# Patient Record
Sex: Male | Born: 1960 | Race: White | Hispanic: No | Marital: Married | Smoking: Never smoker
Health system: Southern US, Community
[De-identification: ages and names within clinical notes are randomized; demographics above are authoritative.]

## PROBLEM LIST (undated history)

## (undated) DIAGNOSIS — T7840XA Allergy, unspecified, initial encounter: Secondary | ICD-10-CM

## (undated) HISTORY — DX: Allergy, unspecified, initial encounter: T78.40XA

---

## 1996-04-16 HISTORY — PX: KNEE SURGERY: SHX244

## 2010-03-29 ENCOUNTER — Ambulatory Visit: Payer: Self-pay | Admitting: Allergy

## 2012-04-07 ENCOUNTER — Ambulatory Visit: Payer: Self-pay | Admitting: Gastroenterology

## 2015-10-21 DIAGNOSIS — J3089 Other allergic rhinitis: Secondary | ICD-10-CM | POA: Diagnosis not present

## 2015-10-21 DIAGNOSIS — J301 Allergic rhinitis due to pollen: Secondary | ICD-10-CM | POA: Diagnosis not present

## 2015-10-21 DIAGNOSIS — J3081 Allergic rhinitis due to animal (cat) (dog) hair and dander: Secondary | ICD-10-CM | POA: Diagnosis not present

## 2015-10-27 DIAGNOSIS — J301 Allergic rhinitis due to pollen: Secondary | ICD-10-CM | POA: Diagnosis not present

## 2015-10-27 DIAGNOSIS — J3081 Allergic rhinitis due to animal (cat) (dog) hair and dander: Secondary | ICD-10-CM | POA: Diagnosis not present

## 2015-10-27 DIAGNOSIS — J4540 Moderate persistent asthma, uncomplicated: Secondary | ICD-10-CM | POA: Diagnosis not present

## 2015-10-27 DIAGNOSIS — J3089 Other allergic rhinitis: Secondary | ICD-10-CM | POA: Diagnosis not present

## 2015-10-31 DIAGNOSIS — J3089 Other allergic rhinitis: Secondary | ICD-10-CM | POA: Diagnosis not present

## 2015-10-31 DIAGNOSIS — J3081 Allergic rhinitis due to animal (cat) (dog) hair and dander: Secondary | ICD-10-CM | POA: Diagnosis not present

## 2015-11-04 DIAGNOSIS — J3089 Other allergic rhinitis: Secondary | ICD-10-CM | POA: Diagnosis not present

## 2015-11-04 DIAGNOSIS — J3081 Allergic rhinitis due to animal (cat) (dog) hair and dander: Secondary | ICD-10-CM | POA: Diagnosis not present

## 2015-11-04 DIAGNOSIS — J301 Allergic rhinitis due to pollen: Secondary | ICD-10-CM | POA: Diagnosis not present

## 2015-11-08 DIAGNOSIS — J301 Allergic rhinitis due to pollen: Secondary | ICD-10-CM | POA: Diagnosis not present

## 2015-11-08 DIAGNOSIS — J3081 Allergic rhinitis due to animal (cat) (dog) hair and dander: Secondary | ICD-10-CM | POA: Diagnosis not present

## 2015-11-08 DIAGNOSIS — J3089 Other allergic rhinitis: Secondary | ICD-10-CM | POA: Diagnosis not present

## 2015-11-22 DIAGNOSIS — J3089 Other allergic rhinitis: Secondary | ICD-10-CM | POA: Diagnosis not present

## 2015-11-22 DIAGNOSIS — J3081 Allergic rhinitis due to animal (cat) (dog) hair and dander: Secondary | ICD-10-CM | POA: Diagnosis not present

## 2015-11-22 DIAGNOSIS — J301 Allergic rhinitis due to pollen: Secondary | ICD-10-CM | POA: Diagnosis not present

## 2015-11-24 DIAGNOSIS — J3089 Other allergic rhinitis: Secondary | ICD-10-CM | POA: Diagnosis not present

## 2015-11-24 DIAGNOSIS — J301 Allergic rhinitis due to pollen: Secondary | ICD-10-CM | POA: Diagnosis not present

## 2015-11-24 DIAGNOSIS — J3081 Allergic rhinitis due to animal (cat) (dog) hair and dander: Secondary | ICD-10-CM | POA: Diagnosis not present

## 2015-11-29 DIAGNOSIS — J3081 Allergic rhinitis due to animal (cat) (dog) hair and dander: Secondary | ICD-10-CM | POA: Diagnosis not present

## 2015-11-29 DIAGNOSIS — J3089 Other allergic rhinitis: Secondary | ICD-10-CM | POA: Diagnosis not present

## 2015-11-29 DIAGNOSIS — J301 Allergic rhinitis due to pollen: Secondary | ICD-10-CM | POA: Diagnosis not present

## 2015-12-01 DIAGNOSIS — J3081 Allergic rhinitis due to animal (cat) (dog) hair and dander: Secondary | ICD-10-CM | POA: Diagnosis not present

## 2015-12-01 DIAGNOSIS — J301 Allergic rhinitis due to pollen: Secondary | ICD-10-CM | POA: Diagnosis not present

## 2015-12-01 DIAGNOSIS — J3089 Other allergic rhinitis: Secondary | ICD-10-CM | POA: Diagnosis not present

## 2015-12-06 DIAGNOSIS — J3081 Allergic rhinitis due to animal (cat) (dog) hair and dander: Secondary | ICD-10-CM | POA: Diagnosis not present

## 2015-12-06 DIAGNOSIS — J301 Allergic rhinitis due to pollen: Secondary | ICD-10-CM | POA: Diagnosis not present

## 2015-12-06 DIAGNOSIS — J3089 Other allergic rhinitis: Secondary | ICD-10-CM | POA: Diagnosis not present

## 2015-12-22 DIAGNOSIS — J301 Allergic rhinitis due to pollen: Secondary | ICD-10-CM | POA: Diagnosis not present

## 2015-12-22 DIAGNOSIS — J3089 Other allergic rhinitis: Secondary | ICD-10-CM | POA: Diagnosis not present

## 2015-12-22 DIAGNOSIS — J3081 Allergic rhinitis due to animal (cat) (dog) hair and dander: Secondary | ICD-10-CM | POA: Diagnosis not present

## 2015-12-29 DIAGNOSIS — J301 Allergic rhinitis due to pollen: Secondary | ICD-10-CM | POA: Diagnosis not present

## 2015-12-29 DIAGNOSIS — J3081 Allergic rhinitis due to animal (cat) (dog) hair and dander: Secondary | ICD-10-CM | POA: Diagnosis not present

## 2015-12-29 DIAGNOSIS — J3089 Other allergic rhinitis: Secondary | ICD-10-CM | POA: Diagnosis not present

## 2016-01-03 DIAGNOSIS — J301 Allergic rhinitis due to pollen: Secondary | ICD-10-CM | POA: Diagnosis not present

## 2016-01-03 DIAGNOSIS — J3089 Other allergic rhinitis: Secondary | ICD-10-CM | POA: Diagnosis not present

## 2016-01-10 DIAGNOSIS — J3089 Other allergic rhinitis: Secondary | ICD-10-CM | POA: Diagnosis not present

## 2016-01-10 DIAGNOSIS — J3081 Allergic rhinitis due to animal (cat) (dog) hair and dander: Secondary | ICD-10-CM | POA: Diagnosis not present

## 2016-01-10 DIAGNOSIS — J301 Allergic rhinitis due to pollen: Secondary | ICD-10-CM | POA: Diagnosis not present

## 2016-01-17 DIAGNOSIS — J301 Allergic rhinitis due to pollen: Secondary | ICD-10-CM | POA: Diagnosis not present

## 2016-01-17 DIAGNOSIS — J3089 Other allergic rhinitis: Secondary | ICD-10-CM | POA: Diagnosis not present

## 2016-01-17 DIAGNOSIS — J3081 Allergic rhinitis due to animal (cat) (dog) hair and dander: Secondary | ICD-10-CM | POA: Diagnosis not present

## 2016-01-26 DIAGNOSIS — J3081 Allergic rhinitis due to animal (cat) (dog) hair and dander: Secondary | ICD-10-CM | POA: Diagnosis not present

## 2016-01-26 DIAGNOSIS — J3089 Other allergic rhinitis: Secondary | ICD-10-CM | POA: Diagnosis not present

## 2016-01-26 DIAGNOSIS — J301 Allergic rhinitis due to pollen: Secondary | ICD-10-CM | POA: Diagnosis not present

## 2016-01-31 DIAGNOSIS — J3089 Other allergic rhinitis: Secondary | ICD-10-CM | POA: Diagnosis not present

## 2016-01-31 DIAGNOSIS — J301 Allergic rhinitis due to pollen: Secondary | ICD-10-CM | POA: Diagnosis not present

## 2016-01-31 DIAGNOSIS — J3081 Allergic rhinitis due to animal (cat) (dog) hair and dander: Secondary | ICD-10-CM | POA: Diagnosis not present

## 2016-02-07 DIAGNOSIS — J3089 Other allergic rhinitis: Secondary | ICD-10-CM | POA: Diagnosis not present

## 2016-02-07 DIAGNOSIS — J301 Allergic rhinitis due to pollen: Secondary | ICD-10-CM | POA: Diagnosis not present

## 2016-02-07 DIAGNOSIS — J3081 Allergic rhinitis due to animal (cat) (dog) hair and dander: Secondary | ICD-10-CM | POA: Diagnosis not present

## 2016-02-14 DIAGNOSIS — J301 Allergic rhinitis due to pollen: Secondary | ICD-10-CM | POA: Diagnosis not present

## 2016-02-14 DIAGNOSIS — J3081 Allergic rhinitis due to animal (cat) (dog) hair and dander: Secondary | ICD-10-CM | POA: Diagnosis not present

## 2016-02-14 DIAGNOSIS — J3089 Other allergic rhinitis: Secondary | ICD-10-CM | POA: Diagnosis not present

## 2016-02-16 DIAGNOSIS — Z23 Encounter for immunization: Secondary | ICD-10-CM | POA: Diagnosis not present

## 2016-02-23 DIAGNOSIS — J301 Allergic rhinitis due to pollen: Secondary | ICD-10-CM | POA: Diagnosis not present

## 2016-02-23 DIAGNOSIS — J3081 Allergic rhinitis due to animal (cat) (dog) hair and dander: Secondary | ICD-10-CM | POA: Diagnosis not present

## 2016-02-23 DIAGNOSIS — J3089 Other allergic rhinitis: Secondary | ICD-10-CM | POA: Diagnosis not present

## 2016-03-13 DIAGNOSIS — J3089 Other allergic rhinitis: Secondary | ICD-10-CM | POA: Diagnosis not present

## 2016-03-13 DIAGNOSIS — J3081 Allergic rhinitis due to animal (cat) (dog) hair and dander: Secondary | ICD-10-CM | POA: Diagnosis not present

## 2016-03-13 DIAGNOSIS — J301 Allergic rhinitis due to pollen: Secondary | ICD-10-CM | POA: Diagnosis not present

## 2016-03-22 DIAGNOSIS — J3089 Other allergic rhinitis: Secondary | ICD-10-CM | POA: Diagnosis not present

## 2016-03-22 DIAGNOSIS — J301 Allergic rhinitis due to pollen: Secondary | ICD-10-CM | POA: Diagnosis not present

## 2016-03-22 DIAGNOSIS — J3081 Allergic rhinitis due to animal (cat) (dog) hair and dander: Secondary | ICD-10-CM | POA: Diagnosis not present

## 2016-03-27 DIAGNOSIS — J301 Allergic rhinitis due to pollen: Secondary | ICD-10-CM | POA: Diagnosis not present

## 2016-03-27 DIAGNOSIS — J3081 Allergic rhinitis due to animal (cat) (dog) hair and dander: Secondary | ICD-10-CM | POA: Diagnosis not present

## 2016-03-27 DIAGNOSIS — J3089 Other allergic rhinitis: Secondary | ICD-10-CM | POA: Diagnosis not present

## 2016-04-03 DIAGNOSIS — J301 Allergic rhinitis due to pollen: Secondary | ICD-10-CM | POA: Diagnosis not present

## 2016-04-03 DIAGNOSIS — J3081 Allergic rhinitis due to animal (cat) (dog) hair and dander: Secondary | ICD-10-CM | POA: Diagnosis not present

## 2016-04-03 DIAGNOSIS — J3089 Other allergic rhinitis: Secondary | ICD-10-CM | POA: Diagnosis not present

## 2016-04-12 DIAGNOSIS — J3089 Other allergic rhinitis: Secondary | ICD-10-CM | POA: Diagnosis not present

## 2016-04-12 DIAGNOSIS — J3081 Allergic rhinitis due to animal (cat) (dog) hair and dander: Secondary | ICD-10-CM | POA: Diagnosis not present

## 2016-04-12 DIAGNOSIS — J301 Allergic rhinitis due to pollen: Secondary | ICD-10-CM | POA: Diagnosis not present

## 2016-04-19 DIAGNOSIS — J301 Allergic rhinitis due to pollen: Secondary | ICD-10-CM | POA: Diagnosis not present

## 2016-04-19 DIAGNOSIS — J3081 Allergic rhinitis due to animal (cat) (dog) hair and dander: Secondary | ICD-10-CM | POA: Diagnosis not present

## 2016-04-19 DIAGNOSIS — J3089 Other allergic rhinitis: Secondary | ICD-10-CM | POA: Diagnosis not present

## 2016-04-24 DIAGNOSIS — J3089 Other allergic rhinitis: Secondary | ICD-10-CM | POA: Diagnosis not present

## 2016-04-24 DIAGNOSIS — J3081 Allergic rhinitis due to animal (cat) (dog) hair and dander: Secondary | ICD-10-CM | POA: Diagnosis not present

## 2016-04-24 DIAGNOSIS — J301 Allergic rhinitis due to pollen: Secondary | ICD-10-CM | POA: Diagnosis not present

## 2016-05-01 DIAGNOSIS — J3089 Other allergic rhinitis: Secondary | ICD-10-CM | POA: Diagnosis not present

## 2016-05-01 DIAGNOSIS — J301 Allergic rhinitis due to pollen: Secondary | ICD-10-CM | POA: Diagnosis not present

## 2016-05-01 DIAGNOSIS — J3081 Allergic rhinitis due to animal (cat) (dog) hair and dander: Secondary | ICD-10-CM | POA: Diagnosis not present

## 2016-05-08 ENCOUNTER — Encounter: Payer: Self-pay | Admitting: Family Medicine

## 2016-05-08 ENCOUNTER — Ambulatory Visit (INDEPENDENT_AMBULATORY_CARE_PROVIDER_SITE_OTHER): Payer: BLUE CROSS/BLUE SHIELD | Admitting: Family Medicine

## 2016-05-08 DIAGNOSIS — Z0000 Encounter for general adult medical examination without abnormal findings: Secondary | ICD-10-CM | POA: Insufficient documentation

## 2016-05-08 DIAGNOSIS — Z833 Family history of diabetes mellitus: Secondary | ICD-10-CM | POA: Diagnosis not present

## 2016-05-08 LAB — CBC WITH DIFFERENTIAL/PLATELET
BASOS ABS: 75 {cells}/uL (ref 0–200)
Basophils Relative: 1 %
EOS ABS: 225 {cells}/uL (ref 15–500)
Eosinophils Relative: 3 %
HEMATOCRIT: 42 % (ref 38.5–50.0)
Hemoglobin: 14.1 g/dL (ref 13.2–17.1)
LYMPHS PCT: 32 %
Lymphs Abs: 2400 cells/uL (ref 850–3900)
MCH: 31.8 pg (ref 27.0–33.0)
MCHC: 33.6 g/dL (ref 32.0–36.0)
MCV: 94.8 fL (ref 80.0–100.0)
MONOS PCT: 8 %
MPV: 11.1 fL (ref 7.5–12.5)
Monocytes Absolute: 600 cells/uL (ref 200–950)
NEUTROS PCT: 56 %
Neutro Abs: 4200 cells/uL (ref 1500–7800)
Platelets: 240 10*3/uL (ref 140–400)
RBC: 4.43 MIL/uL (ref 4.20–5.80)
RDW: 13.4 % (ref 11.0–15.0)
WBC: 7.5 10*3/uL (ref 3.8–10.8)

## 2016-05-08 LAB — POCT GLYCOSYLATED HEMOGLOBIN (HGB A1C): Hemoglobin A1C: 5.5

## 2016-05-08 NOTE — Progress Notes (Signed)
Name: Jordan Benton   MRN: [id number]    DOB: [birth year]   Date:05/08/2016       Progress Note  Subjective  Chief Complaint  Chief Complaint  Patient presents with  . Annual Exam    CPE    HPI  Pt. Presents for Annual Physical exam. He is doing well. Last colonoscopy was 5 years ago, repeat in 5 years He was getting regular prostate exams by previous PCP Dr. Marguerite OleaMoffett.     Past Medical History:  Diagnosis Date  . Allergy     History reviewed. No pertinent surgical history.  Family History  Problem Relation Age of Onset  . COPD Mother   . Diabetes Mother     Social History   Social History  . Marital status: Married    Spouse name: N/A  . Number of children: N/A  . Years of education: N/A   Occupational History  . Not on file.   Social History Main Topics  . Smoking status: Never Smoker  . Smokeless tobacco: Never Used  . Alcohol use Yes     Comment: occasional  . Drug use: No  . Sexual activity: Not on file   Other Topics Concern  . Not on file   Social History Narrative  . No narrative on file     Current Outpatient Prescriptions:  .  montelukast (SINGULAIR) 10 MG tablet, Take 10 mg by mouth daily., Disp: , Rfl:   No Known Allergies   Review of Systems  Constitutional: Negative for chills, fever, malaise/fatigue and weight loss.  HENT: Negative for congestion, sinus pain and sore throat.   Eyes: Negative for blurred vision and double vision.  Respiratory: Negative for cough, sputum production and shortness of breath.   Cardiovascular: Negative for chest pain, palpitations and leg swelling.  Gastrointestinal: Negative for abdominal pain, blood in stool, constipation, diarrhea, nausea and vomiting.  Genitourinary: Negative for dysuria and hematuria.  Musculoskeletal: Negative for back pain, joint pain, myalgias and neck pain.  Neurological: Negative for dizziness and headaches.  Psychiatric/Behavioral: Negative for depression. The patient is  not nervous/anxious and does not have insomnia.       Objective  Vitals:   05/08/16 1400  BP: 116/68  Pulse: 86  Resp: 16  Temp: 98.6 F (37 C)  TempSrc: Oral  SpO2: 96%  Weight: 227 lb 4.8 oz (103.1 kg)  Height: 5\' 11"  (1.803 m)    Physical Exam  Constitutional: He is oriented to person, place, and time and well-developed, well-nourished, and in no distress.  HENT:  Head: Normocephalic and atraumatic.  Left Ear: Tympanic membrane and ear canal normal.  Mouth/Throat: No posterior oropharyngeal erythema.  Right ear canal with cerumen impaction.  Cardiovascular: Normal rate, regular rhythm, S1 normal, S2 normal and normal heart sounds.   No murmur heard. Pulmonary/Chest: Effort normal and breath sounds normal. He has no wheezes.  Abdominal: Soft. Bowel sounds are normal. There is no tenderness.  Genitourinary: Rectum normal and prostate normal. Prostate is not tender.  Musculoskeletal:       Right ankle: He exhibits no swelling.       Left ankle: He exhibits no swelling.  Neurological: He is alert and oriented to person, place, and time.  Psychiatric: Mood, memory, affect and judgment normal.  Nursing note and vitals reviewed.     Assessment & Plan  1. Annual physical exam Obtain age-appropriate laboratory screenings - CBC with Differential - Lipid Profile - COMPLETE METABOLIC PANEL WITH GFR -  PSA - TSH - Vitamin D (25 hydroxy)  2. Family history of diabetes mellitus in mother RFK A1c is 5.5%, considered normal - POCT HgB A1C   Shalon Councilman Asad [facility] 05/08/2016 2:29 PM

## 2016-05-09 LAB — COMPLETE METABOLIC PANEL WITH GFR
ALT: 20 U/L (ref 9–46)
AST: 18 U/L (ref 10–35)
Albumin: 4.3 g/dL (ref 3.6–5.1)
Alkaline Phosphatase: 66 U/L (ref 40–115)
BILIRUBIN TOTAL: 0.6 mg/dL (ref 0.2–1.2)
BUN: 19 mg/dL (ref 7–25)
CO2: 27 mmol/L (ref 20–31)
Calcium: 9.2 mg/dL (ref 8.6–10.3)
Chloride: 103 mmol/L (ref 98–110)
Creat: 1 mg/dL (ref 0.70–1.33)
GFR, EST NON AFRICAN AMERICAN: 84 mL/min (ref >=60)
GFR, Est African American: >89 mL/min (ref >=60)
GLUCOSE: 83 mg/dL (ref 65–99)
Potassium: 4.3 mmol/L (ref 3.5–5.3)
SODIUM: 141 mmol/L (ref 135–146)
TOTAL PROTEIN: 6.5 g/dL (ref 6.1–8.1)

## 2016-05-09 LAB — VITAMIN D 25 HYDROXY (VIT D DEFICIENCY, FRACTURES): VIT D 25 HYDROXY: 31 ng/mL (ref 30–100)

## 2016-05-09 LAB — LIPID PANEL
Cholesterol: 147 mg/dL (ref <200)
HDL: 47 mg/dL (ref >40)
LDL CALC: 83 mg/dL (ref <100)
Total CHOL/HDL Ratio: 3.1 Ratio (ref <5.0)
Triglycerides: 86 mg/dL (ref <150)
VLDL: 17 mg/dL (ref <30)

## 2016-05-09 LAB — PSA: PSA: 0.9 ng/mL (ref <=4.0)

## 2016-05-09 LAB — TSH: TSH: 1.3 mIU/L (ref 0.40–4.50)

## 2016-05-10 DIAGNOSIS — J3089 Other allergic rhinitis: Secondary | ICD-10-CM | POA: Diagnosis not present

## 2016-05-10 DIAGNOSIS — J3081 Allergic rhinitis due to animal (cat) (dog) hair and dander: Secondary | ICD-10-CM | POA: Diagnosis not present

## 2016-05-10 DIAGNOSIS — J301 Allergic rhinitis due to pollen: Secondary | ICD-10-CM | POA: Diagnosis not present

## 2016-05-15 DIAGNOSIS — J301 Allergic rhinitis due to pollen: Secondary | ICD-10-CM | POA: Diagnosis not present

## 2016-05-15 DIAGNOSIS — J3081 Allergic rhinitis due to animal (cat) (dog) hair and dander: Secondary | ICD-10-CM | POA: Diagnosis not present

## 2016-05-15 DIAGNOSIS — J3089 Other allergic rhinitis: Secondary | ICD-10-CM | POA: Diagnosis not present

## 2016-05-22 DIAGNOSIS — J301 Allergic rhinitis due to pollen: Secondary | ICD-10-CM | POA: Diagnosis not present

## 2016-05-22 DIAGNOSIS — J3081 Allergic rhinitis due to animal (cat) (dog) hair and dander: Secondary | ICD-10-CM | POA: Diagnosis not present

## 2016-05-22 DIAGNOSIS — J3089 Other allergic rhinitis: Secondary | ICD-10-CM | POA: Diagnosis not present

## 2016-06-07 DIAGNOSIS — J3081 Allergic rhinitis due to animal (cat) (dog) hair and dander: Secondary | ICD-10-CM | POA: Diagnosis not present

## 2016-06-07 DIAGNOSIS — J3089 Other allergic rhinitis: Secondary | ICD-10-CM | POA: Diagnosis not present

## 2016-06-07 DIAGNOSIS — J301 Allergic rhinitis due to pollen: Secondary | ICD-10-CM | POA: Diagnosis not present

## 2016-06-12 DIAGNOSIS — J3081 Allergic rhinitis due to animal (cat) (dog) hair and dander: Secondary | ICD-10-CM | POA: Diagnosis not present

## 2016-06-12 DIAGNOSIS — J3089 Other allergic rhinitis: Secondary | ICD-10-CM | POA: Diagnosis not present

## 2016-06-12 DIAGNOSIS — J301 Allergic rhinitis due to pollen: Secondary | ICD-10-CM | POA: Diagnosis not present

## 2016-06-21 DIAGNOSIS — J3081 Allergic rhinitis due to animal (cat) (dog) hair and dander: Secondary | ICD-10-CM | POA: Diagnosis not present

## 2016-06-21 DIAGNOSIS — J301 Allergic rhinitis due to pollen: Secondary | ICD-10-CM | POA: Diagnosis not present

## 2016-06-21 DIAGNOSIS — J3089 Other allergic rhinitis: Secondary | ICD-10-CM | POA: Diagnosis not present

## 2016-06-26 DIAGNOSIS — J3089 Other allergic rhinitis: Secondary | ICD-10-CM | POA: Diagnosis not present

## 2016-06-26 DIAGNOSIS — J3081 Allergic rhinitis due to animal (cat) (dog) hair and dander: Secondary | ICD-10-CM | POA: Diagnosis not present

## 2016-06-26 DIAGNOSIS — J301 Allergic rhinitis due to pollen: Secondary | ICD-10-CM | POA: Diagnosis not present

## 2016-07-03 DIAGNOSIS — J3089 Other allergic rhinitis: Secondary | ICD-10-CM | POA: Diagnosis not present

## 2016-07-03 DIAGNOSIS — J301 Allergic rhinitis due to pollen: Secondary | ICD-10-CM | POA: Diagnosis not present

## 2016-07-03 DIAGNOSIS — J3081 Allergic rhinitis due to animal (cat) (dog) hair and dander: Secondary | ICD-10-CM | POA: Diagnosis not present

## 2016-07-10 DIAGNOSIS — J3089 Other allergic rhinitis: Secondary | ICD-10-CM | POA: Diagnosis not present

## 2016-07-10 DIAGNOSIS — J3081 Allergic rhinitis due to animal (cat) (dog) hair and dander: Secondary | ICD-10-CM | POA: Diagnosis not present

## 2016-07-10 DIAGNOSIS — J301 Allergic rhinitis due to pollen: Secondary | ICD-10-CM | POA: Diagnosis not present

## 2016-07-17 DIAGNOSIS — J3081 Allergic rhinitis due to animal (cat) (dog) hair and dander: Secondary | ICD-10-CM | POA: Diagnosis not present

## 2016-07-17 DIAGNOSIS — J301 Allergic rhinitis due to pollen: Secondary | ICD-10-CM | POA: Diagnosis not present

## 2016-07-17 DIAGNOSIS — J3089 Other allergic rhinitis: Secondary | ICD-10-CM | POA: Diagnosis not present

## 2016-07-24 DIAGNOSIS — J3081 Allergic rhinitis due to animal (cat) (dog) hair and dander: Secondary | ICD-10-CM | POA: Diagnosis not present

## 2016-07-24 DIAGNOSIS — J3089 Other allergic rhinitis: Secondary | ICD-10-CM | POA: Diagnosis not present

## 2016-07-24 DIAGNOSIS — J301 Allergic rhinitis due to pollen: Secondary | ICD-10-CM | POA: Diagnosis not present

## 2016-07-31 DIAGNOSIS — J301 Allergic rhinitis due to pollen: Secondary | ICD-10-CM | POA: Diagnosis not present

## 2016-07-31 DIAGNOSIS — J3089 Other allergic rhinitis: Secondary | ICD-10-CM | POA: Diagnosis not present

## 2016-07-31 DIAGNOSIS — J3081 Allergic rhinitis due to animal (cat) (dog) hair and dander: Secondary | ICD-10-CM | POA: Diagnosis not present

## 2016-08-01 DIAGNOSIS — J3081 Allergic rhinitis due to animal (cat) (dog) hair and dander: Secondary | ICD-10-CM | POA: Diagnosis not present

## 2016-08-01 DIAGNOSIS — J3089 Other allergic rhinitis: Secondary | ICD-10-CM | POA: Diagnosis not present

## 2016-08-07 DIAGNOSIS — J3089 Other allergic rhinitis: Secondary | ICD-10-CM | POA: Diagnosis not present

## 2016-08-07 DIAGNOSIS — J3081 Allergic rhinitis due to animal (cat) (dog) hair and dander: Secondary | ICD-10-CM | POA: Diagnosis not present

## 2016-08-07 DIAGNOSIS — J301 Allergic rhinitis due to pollen: Secondary | ICD-10-CM | POA: Diagnosis not present

## 2016-08-14 DIAGNOSIS — J3081 Allergic rhinitis due to animal (cat) (dog) hair and dander: Secondary | ICD-10-CM | POA: Diagnosis not present

## 2016-08-14 DIAGNOSIS — J3089 Other allergic rhinitis: Secondary | ICD-10-CM | POA: Diagnosis not present

## 2016-08-14 DIAGNOSIS — J301 Allergic rhinitis due to pollen: Secondary | ICD-10-CM | POA: Diagnosis not present

## 2016-08-21 DIAGNOSIS — J301 Allergic rhinitis due to pollen: Secondary | ICD-10-CM | POA: Diagnosis not present

## 2016-08-21 DIAGNOSIS — J3089 Other allergic rhinitis: Secondary | ICD-10-CM | POA: Diagnosis not present

## 2016-08-21 DIAGNOSIS — J3081 Allergic rhinitis due to animal (cat) (dog) hair and dander: Secondary | ICD-10-CM | POA: Diagnosis not present

## 2016-08-28 DIAGNOSIS — J3089 Other allergic rhinitis: Secondary | ICD-10-CM | POA: Diagnosis not present

## 2016-08-28 DIAGNOSIS — J3081 Allergic rhinitis due to animal (cat) (dog) hair and dander: Secondary | ICD-10-CM | POA: Diagnosis not present

## 2016-08-28 DIAGNOSIS — J301 Allergic rhinitis due to pollen: Secondary | ICD-10-CM | POA: Diagnosis not present

## 2016-08-30 DIAGNOSIS — J3081 Allergic rhinitis due to animal (cat) (dog) hair and dander: Secondary | ICD-10-CM | POA: Diagnosis not present

## 2016-08-30 DIAGNOSIS — J3089 Other allergic rhinitis: Secondary | ICD-10-CM | POA: Diagnosis not present

## 2016-08-30 DIAGNOSIS — J301 Allergic rhinitis due to pollen: Secondary | ICD-10-CM | POA: Diagnosis not present

## 2016-09-04 DIAGNOSIS — J301 Allergic rhinitis due to pollen: Secondary | ICD-10-CM | POA: Diagnosis not present

## 2016-09-04 DIAGNOSIS — J3089 Other allergic rhinitis: Secondary | ICD-10-CM | POA: Diagnosis not present

## 2016-09-04 DIAGNOSIS — J3081 Allergic rhinitis due to animal (cat) (dog) hair and dander: Secondary | ICD-10-CM | POA: Diagnosis not present

## 2016-09-14 DIAGNOSIS — J3081 Allergic rhinitis due to animal (cat) (dog) hair and dander: Secondary | ICD-10-CM | POA: Diagnosis not present

## 2016-09-14 DIAGNOSIS — J3089 Other allergic rhinitis: Secondary | ICD-10-CM | POA: Diagnosis not present

## 2016-09-14 DIAGNOSIS — H5213 Myopia, bilateral: Secondary | ICD-10-CM | POA: Diagnosis not present

## 2016-09-14 DIAGNOSIS — J301 Allergic rhinitis due to pollen: Secondary | ICD-10-CM | POA: Diagnosis not present

## 2016-09-18 DIAGNOSIS — J3089 Other allergic rhinitis: Secondary | ICD-10-CM | POA: Diagnosis not present

## 2016-09-18 DIAGNOSIS — J3081 Allergic rhinitis due to animal (cat) (dog) hair and dander: Secondary | ICD-10-CM | POA: Diagnosis not present

## 2016-09-18 DIAGNOSIS — J301 Allergic rhinitis due to pollen: Secondary | ICD-10-CM | POA: Diagnosis not present

## 2016-09-24 DIAGNOSIS — D225 Melanocytic nevi of trunk: Secondary | ICD-10-CM | POA: Diagnosis not present

## 2016-09-28 DIAGNOSIS — J301 Allergic rhinitis due to pollen: Secondary | ICD-10-CM | POA: Diagnosis not present

## 2016-09-28 DIAGNOSIS — J3081 Allergic rhinitis due to animal (cat) (dog) hair and dander: Secondary | ICD-10-CM | POA: Diagnosis not present

## 2016-09-28 DIAGNOSIS — J3089 Other allergic rhinitis: Secondary | ICD-10-CM | POA: Diagnosis not present

## 2016-10-05 DIAGNOSIS — J3089 Other allergic rhinitis: Secondary | ICD-10-CM | POA: Diagnosis not present

## 2016-10-05 DIAGNOSIS — J3081 Allergic rhinitis due to animal (cat) (dog) hair and dander: Secondary | ICD-10-CM | POA: Diagnosis not present

## 2016-10-05 DIAGNOSIS — J301 Allergic rhinitis due to pollen: Secondary | ICD-10-CM | POA: Diagnosis not present

## 2016-10-16 DIAGNOSIS — J3081 Allergic rhinitis due to animal (cat) (dog) hair and dander: Secondary | ICD-10-CM | POA: Diagnosis not present

## 2016-10-16 DIAGNOSIS — J3089 Other allergic rhinitis: Secondary | ICD-10-CM | POA: Diagnosis not present

## 2016-10-16 DIAGNOSIS — J301 Allergic rhinitis due to pollen: Secondary | ICD-10-CM | POA: Diagnosis not present

## 2016-10-23 DIAGNOSIS — J301 Allergic rhinitis due to pollen: Secondary | ICD-10-CM | POA: Diagnosis not present

## 2016-10-23 DIAGNOSIS — J3081 Allergic rhinitis due to animal (cat) (dog) hair and dander: Secondary | ICD-10-CM | POA: Diagnosis not present

## 2016-10-23 DIAGNOSIS — J3089 Other allergic rhinitis: Secondary | ICD-10-CM | POA: Diagnosis not present

## 2016-10-30 DIAGNOSIS — J3081 Allergic rhinitis due to animal (cat) (dog) hair and dander: Secondary | ICD-10-CM | POA: Diagnosis not present

## 2016-10-30 DIAGNOSIS — J3089 Other allergic rhinitis: Secondary | ICD-10-CM | POA: Diagnosis not present

## 2016-10-30 DIAGNOSIS — J301 Allergic rhinitis due to pollen: Secondary | ICD-10-CM | POA: Diagnosis not present

## 2016-11-13 DIAGNOSIS — J301 Allergic rhinitis due to pollen: Secondary | ICD-10-CM | POA: Diagnosis not present

## 2016-11-13 DIAGNOSIS — J3081 Allergic rhinitis due to animal (cat) (dog) hair and dander: Secondary | ICD-10-CM | POA: Diagnosis not present

## 2016-11-13 DIAGNOSIS — J3089 Other allergic rhinitis: Secondary | ICD-10-CM | POA: Diagnosis not present

## 2016-11-22 DIAGNOSIS — J3089 Other allergic rhinitis: Secondary | ICD-10-CM | POA: Diagnosis not present

## 2016-11-22 DIAGNOSIS — J301 Allergic rhinitis due to pollen: Secondary | ICD-10-CM | POA: Diagnosis not present

## 2016-11-22 DIAGNOSIS — J3081 Allergic rhinitis due to animal (cat) (dog) hair and dander: Secondary | ICD-10-CM | POA: Diagnosis not present

## 2016-11-27 DIAGNOSIS — J301 Allergic rhinitis due to pollen: Secondary | ICD-10-CM | POA: Diagnosis not present

## 2016-11-27 DIAGNOSIS — J3089 Other allergic rhinitis: Secondary | ICD-10-CM | POA: Diagnosis not present

## 2016-11-27 DIAGNOSIS — J3081 Allergic rhinitis due to animal (cat) (dog) hair and dander: Secondary | ICD-10-CM | POA: Diagnosis not present

## 2016-12-06 DIAGNOSIS — J4540 Moderate persistent asthma, uncomplicated: Secondary | ICD-10-CM | POA: Diagnosis not present

## 2016-12-06 DIAGNOSIS — J3081 Allergic rhinitis due to animal (cat) (dog) hair and dander: Secondary | ICD-10-CM | POA: Diagnosis not present

## 2016-12-06 DIAGNOSIS — J3089 Other allergic rhinitis: Secondary | ICD-10-CM | POA: Diagnosis not present

## 2016-12-06 DIAGNOSIS — J301 Allergic rhinitis due to pollen: Secondary | ICD-10-CM | POA: Diagnosis not present

## 2016-12-11 DIAGNOSIS — J3089 Other allergic rhinitis: Secondary | ICD-10-CM | POA: Diagnosis not present

## 2016-12-11 DIAGNOSIS — J3081 Allergic rhinitis due to animal (cat) (dog) hair and dander: Secondary | ICD-10-CM | POA: Diagnosis not present

## 2016-12-11 DIAGNOSIS — J301 Allergic rhinitis due to pollen: Secondary | ICD-10-CM | POA: Diagnosis not present

## 2016-12-18 DIAGNOSIS — J3089 Other allergic rhinitis: Secondary | ICD-10-CM | POA: Diagnosis not present

## 2016-12-18 DIAGNOSIS — J3081 Allergic rhinitis due to animal (cat) (dog) hair and dander: Secondary | ICD-10-CM | POA: Diagnosis not present

## 2016-12-18 DIAGNOSIS — J301 Allergic rhinitis due to pollen: Secondary | ICD-10-CM | POA: Diagnosis not present

## 2016-12-21 DIAGNOSIS — D485 Neoplasm of uncertain behavior of skin: Secondary | ICD-10-CM | POA: Diagnosis not present

## 2016-12-21 DIAGNOSIS — L814 Other melanin hyperpigmentation: Secondary | ICD-10-CM | POA: Diagnosis not present

## 2016-12-28 DIAGNOSIS — J3089 Other allergic rhinitis: Secondary | ICD-10-CM | POA: Diagnosis not present

## 2016-12-28 DIAGNOSIS — J3081 Allergic rhinitis due to animal (cat) (dog) hair and dander: Secondary | ICD-10-CM | POA: Diagnosis not present

## 2016-12-28 DIAGNOSIS — J301 Allergic rhinitis due to pollen: Secondary | ICD-10-CM | POA: Diagnosis not present

## 2017-01-03 DIAGNOSIS — J3081 Allergic rhinitis due to animal (cat) (dog) hair and dander: Secondary | ICD-10-CM | POA: Diagnosis not present

## 2017-01-03 DIAGNOSIS — J3089 Other allergic rhinitis: Secondary | ICD-10-CM | POA: Diagnosis not present

## 2017-01-03 DIAGNOSIS — J301 Allergic rhinitis due to pollen: Secondary | ICD-10-CM | POA: Diagnosis not present

## 2017-01-10 DIAGNOSIS — J3081 Allergic rhinitis due to animal (cat) (dog) hair and dander: Secondary | ICD-10-CM | POA: Diagnosis not present

## 2017-01-10 DIAGNOSIS — J301 Allergic rhinitis due to pollen: Secondary | ICD-10-CM | POA: Diagnosis not present

## 2017-01-10 DIAGNOSIS — J3089 Other allergic rhinitis: Secondary | ICD-10-CM | POA: Diagnosis not present

## 2017-01-17 DIAGNOSIS — J3081 Allergic rhinitis due to animal (cat) (dog) hair and dander: Secondary | ICD-10-CM | POA: Diagnosis not present

## 2017-01-17 DIAGNOSIS — J301 Allergic rhinitis due to pollen: Secondary | ICD-10-CM | POA: Diagnosis not present

## 2017-01-17 DIAGNOSIS — J3089 Other allergic rhinitis: Secondary | ICD-10-CM | POA: Diagnosis not present

## 2017-01-18 DIAGNOSIS — J301 Allergic rhinitis due to pollen: Secondary | ICD-10-CM | POA: Diagnosis not present

## 2017-01-21 DIAGNOSIS — J3081 Allergic rhinitis due to animal (cat) (dog) hair and dander: Secondary | ICD-10-CM | POA: Diagnosis not present

## 2017-01-21 DIAGNOSIS — J3089 Other allergic rhinitis: Secondary | ICD-10-CM | POA: Diagnosis not present

## 2017-01-22 DIAGNOSIS — J3089 Other allergic rhinitis: Secondary | ICD-10-CM | POA: Diagnosis not present

## 2017-01-22 DIAGNOSIS — J3081 Allergic rhinitis due to animal (cat) (dog) hair and dander: Secondary | ICD-10-CM | POA: Diagnosis not present

## 2017-01-22 DIAGNOSIS — J301 Allergic rhinitis due to pollen: Secondary | ICD-10-CM | POA: Diagnosis not present

## 2017-01-31 DIAGNOSIS — J301 Allergic rhinitis due to pollen: Secondary | ICD-10-CM | POA: Diagnosis not present

## 2017-01-31 DIAGNOSIS — J3081 Allergic rhinitis due to animal (cat) (dog) hair and dander: Secondary | ICD-10-CM | POA: Diagnosis not present

## 2017-01-31 DIAGNOSIS — J3089 Other allergic rhinitis: Secondary | ICD-10-CM | POA: Diagnosis not present

## 2017-02-05 DIAGNOSIS — J3089 Other allergic rhinitis: Secondary | ICD-10-CM | POA: Diagnosis not present

## 2017-02-05 DIAGNOSIS — J301 Allergic rhinitis due to pollen: Secondary | ICD-10-CM | POA: Diagnosis not present

## 2017-02-05 DIAGNOSIS — J3081 Allergic rhinitis due to animal (cat) (dog) hair and dander: Secondary | ICD-10-CM | POA: Diagnosis not present

## 2017-02-14 DIAGNOSIS — J3081 Allergic rhinitis due to animal (cat) (dog) hair and dander: Secondary | ICD-10-CM | POA: Diagnosis not present

## 2017-02-14 DIAGNOSIS — J301 Allergic rhinitis due to pollen: Secondary | ICD-10-CM | POA: Diagnosis not present

## 2017-02-14 DIAGNOSIS — J3089 Other allergic rhinitis: Secondary | ICD-10-CM | POA: Diagnosis not present

## 2017-02-19 DIAGNOSIS — J3081 Allergic rhinitis due to animal (cat) (dog) hair and dander: Secondary | ICD-10-CM | POA: Diagnosis not present

## 2017-02-19 DIAGNOSIS — J3089 Other allergic rhinitis: Secondary | ICD-10-CM | POA: Diagnosis not present

## 2017-02-19 DIAGNOSIS — J301 Allergic rhinitis due to pollen: Secondary | ICD-10-CM | POA: Diagnosis not present

## 2017-02-22 DIAGNOSIS — J3081 Allergic rhinitis due to animal (cat) (dog) hair and dander: Secondary | ICD-10-CM | POA: Diagnosis not present

## 2017-02-22 DIAGNOSIS — J301 Allergic rhinitis due to pollen: Secondary | ICD-10-CM | POA: Diagnosis not present

## 2017-02-22 DIAGNOSIS — J3089 Other allergic rhinitis: Secondary | ICD-10-CM | POA: Diagnosis not present

## 2017-02-26 DIAGNOSIS — J3089 Other allergic rhinitis: Secondary | ICD-10-CM | POA: Diagnosis not present

## 2017-02-26 DIAGNOSIS — J301 Allergic rhinitis due to pollen: Secondary | ICD-10-CM | POA: Diagnosis not present

## 2017-02-26 DIAGNOSIS — J3081 Allergic rhinitis due to animal (cat) (dog) hair and dander: Secondary | ICD-10-CM | POA: Diagnosis not present

## 2017-03-22 DIAGNOSIS — J3081 Allergic rhinitis due to animal (cat) (dog) hair and dander: Secondary | ICD-10-CM | POA: Diagnosis not present

## 2017-03-22 DIAGNOSIS — J301 Allergic rhinitis due to pollen: Secondary | ICD-10-CM | POA: Diagnosis not present

## 2017-03-22 DIAGNOSIS — J3089 Other allergic rhinitis: Secondary | ICD-10-CM | POA: Diagnosis not present

## 2017-04-05 DIAGNOSIS — J301 Allergic rhinitis due to pollen: Secondary | ICD-10-CM | POA: Diagnosis not present

## 2017-04-05 DIAGNOSIS — J3081 Allergic rhinitis due to animal (cat) (dog) hair and dander: Secondary | ICD-10-CM | POA: Diagnosis not present

## 2017-04-05 DIAGNOSIS — J3089 Other allergic rhinitis: Secondary | ICD-10-CM | POA: Diagnosis not present

## 2017-04-19 DIAGNOSIS — J3089 Other allergic rhinitis: Secondary | ICD-10-CM | POA: Diagnosis not present

## 2017-04-19 DIAGNOSIS — J301 Allergic rhinitis due to pollen: Secondary | ICD-10-CM | POA: Diagnosis not present

## 2017-04-19 DIAGNOSIS — J3081 Allergic rhinitis due to animal (cat) (dog) hair and dander: Secondary | ICD-10-CM | POA: Diagnosis not present

## 2017-04-25 DIAGNOSIS — J301 Allergic rhinitis due to pollen: Secondary | ICD-10-CM | POA: Diagnosis not present

## 2017-04-25 DIAGNOSIS — J3081 Allergic rhinitis due to animal (cat) (dog) hair and dander: Secondary | ICD-10-CM | POA: Diagnosis not present

## 2017-04-25 DIAGNOSIS — J3089 Other allergic rhinitis: Secondary | ICD-10-CM | POA: Diagnosis not present

## 2017-04-30 DIAGNOSIS — J301 Allergic rhinitis due to pollen: Secondary | ICD-10-CM | POA: Diagnosis not present

## 2017-04-30 DIAGNOSIS — J3089 Other allergic rhinitis: Secondary | ICD-10-CM | POA: Diagnosis not present

## 2017-04-30 DIAGNOSIS — J3081 Allergic rhinitis due to animal (cat) (dog) hair and dander: Secondary | ICD-10-CM | POA: Diagnosis not present

## 2017-05-07 DIAGNOSIS — J3081 Allergic rhinitis due to animal (cat) (dog) hair and dander: Secondary | ICD-10-CM | POA: Diagnosis not present

## 2017-05-07 DIAGNOSIS — J301 Allergic rhinitis due to pollen: Secondary | ICD-10-CM | POA: Diagnosis not present

## 2017-05-07 DIAGNOSIS — J3089 Other allergic rhinitis: Secondary | ICD-10-CM | POA: Diagnosis not present

## 2017-05-10 ENCOUNTER — Encounter: Payer: BLUE CROSS/BLUE SHIELD | Admitting: Family Medicine

## 2017-05-16 DIAGNOSIS — L821 Other seborrheic keratosis: Secondary | ICD-10-CM | POA: Diagnosis not present

## 2017-05-16 DIAGNOSIS — J3081 Allergic rhinitis due to animal (cat) (dog) hair and dander: Secondary | ICD-10-CM | POA: Diagnosis not present

## 2017-05-16 DIAGNOSIS — J3089 Other allergic rhinitis: Secondary | ICD-10-CM | POA: Diagnosis not present

## 2017-05-16 DIAGNOSIS — J301 Allergic rhinitis due to pollen: Secondary | ICD-10-CM | POA: Diagnosis not present

## 2017-05-30 DIAGNOSIS — J3081 Allergic rhinitis due to animal (cat) (dog) hair and dander: Secondary | ICD-10-CM | POA: Diagnosis not present

## 2017-05-30 DIAGNOSIS — J3089 Other allergic rhinitis: Secondary | ICD-10-CM | POA: Diagnosis not present

## 2017-05-30 DIAGNOSIS — J301 Allergic rhinitis due to pollen: Secondary | ICD-10-CM | POA: Diagnosis not present

## 2017-06-11 DIAGNOSIS — J3081 Allergic rhinitis due to animal (cat) (dog) hair and dander: Secondary | ICD-10-CM | POA: Diagnosis not present

## 2017-06-11 DIAGNOSIS — J3089 Other allergic rhinitis: Secondary | ICD-10-CM | POA: Diagnosis not present

## 2017-06-11 DIAGNOSIS — J301 Allergic rhinitis due to pollen: Secondary | ICD-10-CM | POA: Diagnosis not present

## 2017-06-28 DIAGNOSIS — J3089 Other allergic rhinitis: Secondary | ICD-10-CM | POA: Diagnosis not present

## 2017-06-28 DIAGNOSIS — J301 Allergic rhinitis due to pollen: Secondary | ICD-10-CM | POA: Diagnosis not present

## 2017-06-28 DIAGNOSIS — J3081 Allergic rhinitis due to animal (cat) (dog) hair and dander: Secondary | ICD-10-CM | POA: Diagnosis not present

## 2017-07-12 DIAGNOSIS — J3081 Allergic rhinitis due to animal (cat) (dog) hair and dander: Secondary | ICD-10-CM | POA: Diagnosis not present

## 2017-07-12 DIAGNOSIS — J301 Allergic rhinitis due to pollen: Secondary | ICD-10-CM | POA: Diagnosis not present

## 2017-07-12 DIAGNOSIS — J3089 Other allergic rhinitis: Secondary | ICD-10-CM | POA: Diagnosis not present

## 2017-07-25 DIAGNOSIS — J301 Allergic rhinitis due to pollen: Secondary | ICD-10-CM | POA: Diagnosis not present

## 2017-07-25 DIAGNOSIS — J3089 Other allergic rhinitis: Secondary | ICD-10-CM | POA: Diagnosis not present

## 2017-07-25 DIAGNOSIS — J3081 Allergic rhinitis due to animal (cat) (dog) hair and dander: Secondary | ICD-10-CM | POA: Diagnosis not present

## 2017-08-05 DIAGNOSIS — J301 Allergic rhinitis due to pollen: Secondary | ICD-10-CM | POA: Diagnosis not present

## 2017-08-06 DIAGNOSIS — J3081 Allergic rhinitis due to animal (cat) (dog) hair and dander: Secondary | ICD-10-CM | POA: Diagnosis not present

## 2017-08-06 DIAGNOSIS — J301 Allergic rhinitis due to pollen: Secondary | ICD-10-CM | POA: Diagnosis not present

## 2017-08-06 DIAGNOSIS — J3089 Other allergic rhinitis: Secondary | ICD-10-CM | POA: Diagnosis not present

## 2017-08-23 DIAGNOSIS — J3089 Other allergic rhinitis: Secondary | ICD-10-CM | POA: Diagnosis not present

## 2017-08-23 DIAGNOSIS — J301 Allergic rhinitis due to pollen: Secondary | ICD-10-CM | POA: Diagnosis not present

## 2017-08-23 DIAGNOSIS — J3081 Allergic rhinitis due to animal (cat) (dog) hair and dander: Secondary | ICD-10-CM | POA: Diagnosis not present

## 2017-09-06 DIAGNOSIS — J3089 Other allergic rhinitis: Secondary | ICD-10-CM | POA: Diagnosis not present

## 2017-09-06 DIAGNOSIS — J3081 Allergic rhinitis due to animal (cat) (dog) hair and dander: Secondary | ICD-10-CM | POA: Diagnosis not present

## 2017-09-06 DIAGNOSIS — J301 Allergic rhinitis due to pollen: Secondary | ICD-10-CM | POA: Diagnosis not present

## 2017-09-17 DIAGNOSIS — J3089 Other allergic rhinitis: Secondary | ICD-10-CM | POA: Diagnosis not present

## 2017-09-17 DIAGNOSIS — J301 Allergic rhinitis due to pollen: Secondary | ICD-10-CM | POA: Diagnosis not present

## 2017-09-17 DIAGNOSIS — J3081 Allergic rhinitis due to animal (cat) (dog) hair and dander: Secondary | ICD-10-CM | POA: Diagnosis not present

## 2017-09-27 DIAGNOSIS — J3081 Allergic rhinitis due to animal (cat) (dog) hair and dander: Secondary | ICD-10-CM | POA: Diagnosis not present

## 2017-09-27 DIAGNOSIS — J301 Allergic rhinitis due to pollen: Secondary | ICD-10-CM | POA: Diagnosis not present

## 2017-10-04 DIAGNOSIS — J301 Allergic rhinitis due to pollen: Secondary | ICD-10-CM | POA: Diagnosis not present

## 2017-10-04 DIAGNOSIS — J3089 Other allergic rhinitis: Secondary | ICD-10-CM | POA: Diagnosis not present

## 2017-10-04 DIAGNOSIS — J3081 Allergic rhinitis due to animal (cat) (dog) hair and dander: Secondary | ICD-10-CM | POA: Diagnosis not present

## 2017-10-11 DIAGNOSIS — J301 Allergic rhinitis due to pollen: Secondary | ICD-10-CM | POA: Diagnosis not present

## 2017-10-11 DIAGNOSIS — J3081 Allergic rhinitis due to animal (cat) (dog) hair and dander: Secondary | ICD-10-CM | POA: Diagnosis not present

## 2017-10-11 DIAGNOSIS — J3089 Other allergic rhinitis: Secondary | ICD-10-CM | POA: Diagnosis not present

## 2017-10-25 DIAGNOSIS — J3081 Allergic rhinitis due to animal (cat) (dog) hair and dander: Secondary | ICD-10-CM | POA: Diagnosis not present

## 2017-10-25 DIAGNOSIS — J301 Allergic rhinitis due to pollen: Secondary | ICD-10-CM | POA: Diagnosis not present

## 2017-10-25 DIAGNOSIS — J3089 Other allergic rhinitis: Secondary | ICD-10-CM | POA: Diagnosis not present

## 2017-11-07 DIAGNOSIS — J3089 Other allergic rhinitis: Secondary | ICD-10-CM | POA: Diagnosis not present

## 2017-11-07 DIAGNOSIS — J3081 Allergic rhinitis due to animal (cat) (dog) hair and dander: Secondary | ICD-10-CM | POA: Diagnosis not present

## 2017-11-07 DIAGNOSIS — J301 Allergic rhinitis due to pollen: Secondary | ICD-10-CM | POA: Diagnosis not present

## 2017-11-22 DIAGNOSIS — J3081 Allergic rhinitis due to animal (cat) (dog) hair and dander: Secondary | ICD-10-CM | POA: Diagnosis not present

## 2017-11-22 DIAGNOSIS — M79672 Pain in left foot: Secondary | ICD-10-CM | POA: Diagnosis not present

## 2017-11-22 DIAGNOSIS — M722 Plantar fascial fibromatosis: Secondary | ICD-10-CM | POA: Diagnosis not present

## 2017-11-22 DIAGNOSIS — M7732 Calcaneal spur, left foot: Secondary | ICD-10-CM | POA: Diagnosis not present

## 2017-11-22 DIAGNOSIS — J301 Allergic rhinitis due to pollen: Secondary | ICD-10-CM | POA: Diagnosis not present

## 2017-11-22 DIAGNOSIS — J3089 Other allergic rhinitis: Secondary | ICD-10-CM | POA: Diagnosis not present

## 2017-11-22 DIAGNOSIS — M79671 Pain in right foot: Secondary | ICD-10-CM | POA: Diagnosis not present

## 2017-11-29 DIAGNOSIS — J301 Allergic rhinitis due to pollen: Secondary | ICD-10-CM | POA: Diagnosis not present

## 2017-11-29 DIAGNOSIS — J3089 Other allergic rhinitis: Secondary | ICD-10-CM | POA: Diagnosis not present

## 2017-11-29 DIAGNOSIS — J3081 Allergic rhinitis due to animal (cat) (dog) hair and dander: Secondary | ICD-10-CM | POA: Diagnosis not present

## 2017-12-12 DIAGNOSIS — J3081 Allergic rhinitis due to animal (cat) (dog) hair and dander: Secondary | ICD-10-CM | POA: Diagnosis not present

## 2017-12-12 DIAGNOSIS — J301 Allergic rhinitis due to pollen: Secondary | ICD-10-CM | POA: Diagnosis not present

## 2017-12-12 DIAGNOSIS — J3089 Other allergic rhinitis: Secondary | ICD-10-CM | POA: Diagnosis not present

## 2017-12-12 DIAGNOSIS — J4540 Moderate persistent asthma, uncomplicated: Secondary | ICD-10-CM | POA: Diagnosis not present

## 2017-12-20 DIAGNOSIS — M722 Plantar fascial fibromatosis: Secondary | ICD-10-CM | POA: Diagnosis not present

## 2017-12-24 DIAGNOSIS — J3081 Allergic rhinitis due to animal (cat) (dog) hair and dander: Secondary | ICD-10-CM | POA: Diagnosis not present

## 2017-12-24 DIAGNOSIS — J301 Allergic rhinitis due to pollen: Secondary | ICD-10-CM | POA: Diagnosis not present

## 2017-12-24 DIAGNOSIS — J3089 Other allergic rhinitis: Secondary | ICD-10-CM | POA: Diagnosis not present

## 2017-12-26 DIAGNOSIS — J3081 Allergic rhinitis due to animal (cat) (dog) hair and dander: Secondary | ICD-10-CM | POA: Diagnosis not present

## 2017-12-26 DIAGNOSIS — J3089 Other allergic rhinitis: Secondary | ICD-10-CM | POA: Diagnosis not present

## 2017-12-26 DIAGNOSIS — J301 Allergic rhinitis due to pollen: Secondary | ICD-10-CM | POA: Diagnosis not present

## 2018-01-10 DIAGNOSIS — J3089 Other allergic rhinitis: Secondary | ICD-10-CM | POA: Diagnosis not present

## 2018-01-10 DIAGNOSIS — J301 Allergic rhinitis due to pollen: Secondary | ICD-10-CM | POA: Diagnosis not present

## 2018-01-10 DIAGNOSIS — J3081 Allergic rhinitis due to animal (cat) (dog) hair and dander: Secondary | ICD-10-CM | POA: Diagnosis not present

## 2018-01-13 DIAGNOSIS — D485 Neoplasm of uncertain behavior of skin: Secondary | ICD-10-CM | POA: Diagnosis not present

## 2018-01-13 DIAGNOSIS — D2262 Melanocytic nevi of left upper limb, including shoulder: Secondary | ICD-10-CM | POA: Diagnosis not present

## 2018-01-13 DIAGNOSIS — D225 Melanocytic nevi of trunk: Secondary | ICD-10-CM | POA: Diagnosis not present

## 2018-01-13 DIAGNOSIS — R208 Other disturbances of skin sensation: Secondary | ICD-10-CM | POA: Diagnosis not present

## 2018-01-13 DIAGNOSIS — D2261 Melanocytic nevi of right upper limb, including shoulder: Secondary | ICD-10-CM | POA: Diagnosis not present

## 2018-01-13 DIAGNOSIS — L738 Other specified follicular disorders: Secondary | ICD-10-CM | POA: Diagnosis not present

## 2018-01-17 DIAGNOSIS — J301 Allergic rhinitis due to pollen: Secondary | ICD-10-CM | POA: Diagnosis not present

## 2018-01-17 DIAGNOSIS — J3081 Allergic rhinitis due to animal (cat) (dog) hair and dander: Secondary | ICD-10-CM | POA: Diagnosis not present

## 2018-01-17 DIAGNOSIS — M722 Plantar fascial fibromatosis: Secondary | ICD-10-CM | POA: Diagnosis not present

## 2018-01-17 DIAGNOSIS — J3089 Other allergic rhinitis: Secondary | ICD-10-CM | POA: Diagnosis not present

## 2018-01-21 DIAGNOSIS — J301 Allergic rhinitis due to pollen: Secondary | ICD-10-CM | POA: Diagnosis not present

## 2018-01-21 DIAGNOSIS — J3089 Other allergic rhinitis: Secondary | ICD-10-CM | POA: Diagnosis not present

## 2018-01-21 DIAGNOSIS — J3081 Allergic rhinitis due to animal (cat) (dog) hair and dander: Secondary | ICD-10-CM | POA: Diagnosis not present

## 2018-01-28 DIAGNOSIS — J3089 Other allergic rhinitis: Secondary | ICD-10-CM | POA: Diagnosis not present

## 2018-01-28 DIAGNOSIS — J3081 Allergic rhinitis due to animal (cat) (dog) hair and dander: Secondary | ICD-10-CM | POA: Diagnosis not present

## 2018-01-28 DIAGNOSIS — J301 Allergic rhinitis due to pollen: Secondary | ICD-10-CM | POA: Diagnosis not present

## 2018-02-04 DIAGNOSIS — J3081 Allergic rhinitis due to animal (cat) (dog) hair and dander: Secondary | ICD-10-CM | POA: Diagnosis not present

## 2018-02-04 DIAGNOSIS — J3089 Other allergic rhinitis: Secondary | ICD-10-CM | POA: Diagnosis not present

## 2018-02-04 DIAGNOSIS — J301 Allergic rhinitis due to pollen: Secondary | ICD-10-CM | POA: Diagnosis not present

## 2018-02-18 DIAGNOSIS — J3089 Other allergic rhinitis: Secondary | ICD-10-CM | POA: Diagnosis not present

## 2018-02-18 DIAGNOSIS — J3081 Allergic rhinitis due to animal (cat) (dog) hair and dander: Secondary | ICD-10-CM | POA: Diagnosis not present

## 2018-02-18 DIAGNOSIS — J301 Allergic rhinitis due to pollen: Secondary | ICD-10-CM | POA: Diagnosis not present

## 2018-02-25 DIAGNOSIS — J3089 Other allergic rhinitis: Secondary | ICD-10-CM | POA: Diagnosis not present

## 2018-02-25 DIAGNOSIS — J301 Allergic rhinitis due to pollen: Secondary | ICD-10-CM | POA: Diagnosis not present

## 2018-02-25 DIAGNOSIS — J3081 Allergic rhinitis due to animal (cat) (dog) hair and dander: Secondary | ICD-10-CM | POA: Diagnosis not present

## 2018-02-28 DIAGNOSIS — M722 Plantar fascial fibromatosis: Secondary | ICD-10-CM | POA: Diagnosis not present

## 2018-03-07 DIAGNOSIS — J3081 Allergic rhinitis due to animal (cat) (dog) hair and dander: Secondary | ICD-10-CM | POA: Diagnosis not present

## 2018-03-07 DIAGNOSIS — J3089 Other allergic rhinitis: Secondary | ICD-10-CM | POA: Diagnosis not present

## 2018-03-07 DIAGNOSIS — J301 Allergic rhinitis due to pollen: Secondary | ICD-10-CM | POA: Diagnosis not present

## 2018-03-11 DIAGNOSIS — J3089 Other allergic rhinitis: Secondary | ICD-10-CM | POA: Diagnosis not present

## 2018-03-11 DIAGNOSIS — J301 Allergic rhinitis due to pollen: Secondary | ICD-10-CM | POA: Diagnosis not present

## 2018-03-21 DIAGNOSIS — J3081 Allergic rhinitis due to animal (cat) (dog) hair and dander: Secondary | ICD-10-CM | POA: Diagnosis not present

## 2018-03-21 DIAGNOSIS — J301 Allergic rhinitis due to pollen: Secondary | ICD-10-CM | POA: Diagnosis not present

## 2018-03-21 DIAGNOSIS — J3089 Other allergic rhinitis: Secondary | ICD-10-CM | POA: Diagnosis not present

## 2018-03-25 DIAGNOSIS — J3081 Allergic rhinitis due to animal (cat) (dog) hair and dander: Secondary | ICD-10-CM | POA: Diagnosis not present

## 2018-03-25 DIAGNOSIS — J3089 Other allergic rhinitis: Secondary | ICD-10-CM | POA: Diagnosis not present

## 2018-03-25 DIAGNOSIS — J301 Allergic rhinitis due to pollen: Secondary | ICD-10-CM | POA: Diagnosis not present

## 2018-04-01 DIAGNOSIS — J301 Allergic rhinitis due to pollen: Secondary | ICD-10-CM | POA: Diagnosis not present

## 2018-04-01 DIAGNOSIS — J3081 Allergic rhinitis due to animal (cat) (dog) hair and dander: Secondary | ICD-10-CM | POA: Diagnosis not present

## 2018-04-01 DIAGNOSIS — J3089 Other allergic rhinitis: Secondary | ICD-10-CM | POA: Diagnosis not present

## 2018-04-04 DIAGNOSIS — J301 Allergic rhinitis due to pollen: Secondary | ICD-10-CM | POA: Diagnosis not present

## 2018-04-07 DIAGNOSIS — J3089 Other allergic rhinitis: Secondary | ICD-10-CM | POA: Diagnosis not present

## 2018-04-07 DIAGNOSIS — J3081 Allergic rhinitis due to animal (cat) (dog) hair and dander: Secondary | ICD-10-CM | POA: Diagnosis not present

## 2018-04-10 DIAGNOSIS — J301 Allergic rhinitis due to pollen: Secondary | ICD-10-CM | POA: Diagnosis not present

## 2018-04-10 DIAGNOSIS — J3081 Allergic rhinitis due to animal (cat) (dog) hair and dander: Secondary | ICD-10-CM | POA: Diagnosis not present

## 2018-04-10 DIAGNOSIS — J3089 Other allergic rhinitis: Secondary | ICD-10-CM | POA: Diagnosis not present

## 2018-04-15 DIAGNOSIS — J3081 Allergic rhinitis due to animal (cat) (dog) hair and dander: Secondary | ICD-10-CM | POA: Diagnosis not present

## 2018-04-15 DIAGNOSIS — J3089 Other allergic rhinitis: Secondary | ICD-10-CM | POA: Diagnosis not present

## 2018-04-15 DIAGNOSIS — J301 Allergic rhinitis due to pollen: Secondary | ICD-10-CM | POA: Diagnosis not present

## 2018-06-26 DIAGNOSIS — Z9109 Other allergy status, other than to drugs and biological substances: Secondary | ICD-10-CM | POA: Insufficient documentation

## 2019-07-09 ENCOUNTER — Ambulatory Visit: Payer: Self-pay | Attending: Internal Medicine

## 2019-07-09 DIAGNOSIS — Z23 Encounter for immunization: Secondary | ICD-10-CM

## 2019-07-09 NOTE — Progress Notes (Signed)
   Covid-19 Vaccination Clinic  Name:  Jordan Benton    MRN: [id number] DOB: [birth year]  07/09/2019  [name] was observed post Covid-19 immunization for 15 minutes without incident. He was provided with Vaccine Information Sheet and instruction to access the V-Safe system.   [name] was instructed to call 911 with any severe reactions post vaccine: Marland Kitchen Difficulty breathing  . Swelling of face and throat  . A fast heartbeat  . A bad rash all over body  . Dizziness and weakness   Immunizations Administered    Name Date Dose VIS Date Route   Pfizer COVID-19 Vaccine 07/09/2019  3:49 PM 0.3 mL 03/27/2019 Intramuscular   Manufacturer: [employer], [employer]   Lot: [id number]   NDC: [address]-3

## 2019-08-03 ENCOUNTER — Ambulatory Visit: Payer: Self-pay | Attending: Internal Medicine

## 2019-08-03 DIAGNOSIS — Z23 Encounter for immunization: Secondary | ICD-10-CM

## 2019-08-03 NOTE — Progress Notes (Signed)
   Covid-19 Vaccination Clinic  Name:  Jordan Benton    MRN: [id number] DOB: 1960/06/20  08/03/2019  Jordan Benton was observed post Covid-19 immunization for 15 minutes without incident. He was provided with Vaccine Information Sheet and instruction to access the V-Safe system.   Jordan Benton was instructed to call 911 with any severe reactions post vaccine: Marland Kitchen Difficulty breathing  . Swelling of face and throat  . A fast heartbeat  . A bad rash all over body  . Dizziness and weakness   Immunizations Administered    Name Date Dose VIS Date Route   Pfizer COVID-19 Vaccine 08/03/2019  3:29 PM 0.3 mL 06/10/2018 Intramuscular   Manufacturer: [employer], [employer]   Lot: [id number]   NDC: [address]-3

## 2021-01-30 DIAGNOSIS — I83812 Varicose veins of left lower extremities with pain: Secondary | ICD-10-CM | POA: Insufficient documentation

## 2021-01-30 DIAGNOSIS — N401 Enlarged prostate with lower urinary tract symptoms: Secondary | ICD-10-CM | POA: Insufficient documentation

## 2021-02-28 ENCOUNTER — Other Ambulatory Visit (INDEPENDENT_AMBULATORY_CARE_PROVIDER_SITE_OTHER): Payer: Self-pay | Admitting: Nurse Practitioner

## 2021-02-28 DIAGNOSIS — I83812 Varicose veins of left lower extremities with pain: Secondary | ICD-10-CM

## 2021-03-01 ENCOUNTER — Other Ambulatory Visit: Payer: Self-pay

## 2021-03-01 ENCOUNTER — Ambulatory Visit (INDEPENDENT_AMBULATORY_CARE_PROVIDER_SITE_OTHER): Payer: Managed Care, Other (non HMO)

## 2021-03-01 ENCOUNTER — Encounter (INDEPENDENT_AMBULATORY_CARE_PROVIDER_SITE_OTHER): Payer: Self-pay | Admitting: Nurse Practitioner

## 2021-03-01 ENCOUNTER — Ambulatory Visit (INDEPENDENT_AMBULATORY_CARE_PROVIDER_SITE_OTHER): Payer: Managed Care, Other (non HMO) | Admitting: Nurse Practitioner

## 2021-03-01 VITALS — BP 142/84 | HR 68 | Resp 16 | Ht 70.0 in | Wt 216.8 lb

## 2021-03-01 DIAGNOSIS — I83812 Varicose veins of left lower extremities with pain: Secondary | ICD-10-CM

## 2021-03-05 ENCOUNTER — Encounter (INDEPENDENT_AMBULATORY_CARE_PROVIDER_SITE_OTHER): Payer: Self-pay | Admitting: Nurse Practitioner

## 2021-03-05 NOTE — Progress Notes (Signed)
Subjective:    Patient ID: Jordan Benton, male    DOB: [birth year], 60 y.o.   MRN: [id number] Chief Complaint  Patient presents with   New Patient (Initial Visit)    Ref Burnadette Pop lle vv w/pain    Jordan Benton is a 60 year old male that presents today for evaluation of painful varicosities of the left lower extremity.  The patient has previously had treatment of his bilateral varicose veins.  He notes that the left little stripped.  This was done over 15 years ago.  He also notes that he has had foam sclerotherapy done to them as well.  However despite continued conservative therapy including use of compression socks, elevation and activity the patient continues to have numerous large varicosities that have become painful and uncomfortable.  The patient notes that on days where he stands for longer periods the pain makes it difficult for doing normal activities of daily living.  There are no open wounds or ulcerations.  Today noninvasive studies show no evidence of DVT or superficial thrombophlebitis.  The right lower extremity has evidence of deep venous insufficiency.  The left lower extremity also has evidence of deep venous insufficiency in the common femoral vein.  There is also a noted left medial posterior accessory vein.   Review of Systems  Cardiovascular:  Positive for leg swelling.  All other systems reviewed and are negative.     Objective:   Physical Exam Vitals reviewed.  HENT:     Head: Normocephalic.  Cardiovascular:     Rate and Rhythm: Normal rate.     Pulses: Normal pulses.  Pulmonary:     Effort: Pulmonary effort is normal.  Musculoskeletal:        General: Tenderness present.  Skin:    General: Skin is warm and dry.     Comments: Large palpable varicosities in the left thigh  Neurological:     Mental Status: He is alert and oriented to person, place, and time.  Psychiatric:        Mood and Affect: Mood normal.        Behavior: Behavior normal.         Thought Content: Thought content normal.        Judgment: Judgment normal.    BP (!) 142/84 (BP Location: Left Arm)   Pulse 68   Resp 16   Ht 5\' 10"  (1.778 m)   Wt 216 lb 12.8 oz (98.3 kg)   BMI 31.11 kg/m   Past Medical History:  Diagnosis Date   Allergy    followed by Allergist    Social History   Socioeconomic History   Marital status: Married    Spouse name: Not on file   Number of children: Not on file   Years of education: Not on file   Highest education level: Not on file  Occupational History   Not on file  Tobacco Use   Smoking status: Never   Smokeless tobacco: Never  Substance and Sexual Activity   Alcohol use: Yes    Comment: occasional 2-3 glasses of wine/week   Drug use: No   Sexual activity: Yes  Other Topics Concern   Not on file  Social History Narrative   Not on file   Social Determinants of Health   Financial Resource Strain: Not on file  Food Insecurity: Not on file  Transportation Needs: Not on file  Physical Activity: Not on file  Stress: Not on file  Social Connections: Not on file  Intimate Partner Violence: Not on file    Past Surgical History:  Procedure Laterality Date   KNEE SURGERY Left 1998    Family History  Problem Relation Age of Onset   COPD Mother    Diabetes Mother     No Known Allergies  CBC Latest Ref Rng & Units 05/08/2016  WBC 3.8 - 10.8 K/uL 7.5  Hemoglobin 13.2 - 17.1 g/dL 14.1  Hematocrit 38.5 - 50.0 % 42.0  Platelets 140 - 400 K/uL 240      CMP     Component Value Date/Time   NA 141 05/08/2016 1503   K 4.3 05/08/2016 1503   CL 103 05/08/2016 1503   CO2 27 05/08/2016 1503   GLUCOSE 83 05/08/2016 1503   BUN 19 05/08/2016 1503   CREATININE 1.00 05/08/2016 1503   CALCIUM 9.2 05/08/2016 1503   PROT 6.5 05/08/2016 1503   ALBUMIN 4.3 05/08/2016 1503   AST 18 05/08/2016 1503   ALT 20 05/08/2016 1503   ALKPHOS 66 05/08/2016 1503   BILITOT 0.6 05/08/2016 1503   GFRNONAA 84 05/08/2016 1503    GFRAA >89 05/08/2016 1503     No results found.     Assessment & Plan:   1. Varicose veins of left lower extremity with pain Recommend:  The patient has had successful ablation of the previously incompetent saphenous venous system but still has persistent symptoms of pain and swelling that are having a negative impact on daily life and daily activities.  Patient should undergo injection foam sclerotherapy to treat the residual varicosities.  The risks, benefits and alternative therapies were reviewed in detail with the patient.  All questions were answered.  The patient agrees to proceed with sclerotherapy at their convenience.  The patient will continue wearing the graduated compression stockings and using the over-the-counter pain medications to treat her symptoms.        Current Outpatient Medications on File Prior to Visit  Medication Sig Dispense Refill   albuterol (VENTOLIN HFA) 108 (90 Base) MCG/ACT inhaler SMARTSIG:1-2 Puff(s) Via Inhaler Every 4-6 Hours PRN     EPINEPHrine 0.3 mg/0.3 mL IJ SOAJ injection See admin instructions.     montelukast (SINGULAIR) 10 MG tablet Take 10 mg by mouth daily.     tamsulosin (FLOMAX) 0.4 MG CAPS capsule Take 0.4 mg by mouth daily.     No current facility-administered medications on file prior to visit.    There are no Patient Instructions on file for this visit. No follow-ups on file.   Kris Hartmann, NP

## 2021-03-17 ENCOUNTER — Encounter (INDEPENDENT_AMBULATORY_CARE_PROVIDER_SITE_OTHER): Payer: Self-pay

## 2021-03-17 ENCOUNTER — Encounter (INDEPENDENT_AMBULATORY_CARE_PROVIDER_SITE_OTHER): Payer: Self-pay | Admitting: Vascular Surgery

## 2021-05-03 ENCOUNTER — Telehealth (INDEPENDENT_AMBULATORY_CARE_PROVIDER_SITE_OTHER): Payer: Self-pay | Admitting: *Deleted

## 2021-05-03 NOTE — Telephone Encounter (Signed)
Called patient to remind him of his laser appt 05/09/21 and to let him know not to take the Xanax called in as he will not need it for the procedure. Had to leave a massage but advised him to call the office with any questions.

## 2021-05-09 ENCOUNTER — Encounter (INDEPENDENT_AMBULATORY_CARE_PROVIDER_SITE_OTHER): Payer: Self-pay | Admitting: Vascular Surgery

## 2021-05-09 ENCOUNTER — Ambulatory Visit (INDEPENDENT_AMBULATORY_CARE_PROVIDER_SITE_OTHER): Payer: Managed Care, Other (non HMO) | Admitting: Vascular Surgery

## 2021-05-09 ENCOUNTER — Other Ambulatory Visit: Payer: Self-pay

## 2021-05-09 VITALS — BP 143/75 | HR 74 | Resp 16 | Wt 217.8 lb

## 2021-05-09 DIAGNOSIS — J309 Allergic rhinitis, unspecified: Secondary | ICD-10-CM | POA: Insufficient documentation

## 2021-05-09 DIAGNOSIS — J301 Allergic rhinitis due to pollen: Secondary | ICD-10-CM | POA: Insufficient documentation

## 2021-05-09 DIAGNOSIS — I83812 Varicose veins of left lower extremities with pain: Secondary | ICD-10-CM | POA: Diagnosis not present

## 2021-05-09 DIAGNOSIS — K219 Gastro-esophageal reflux disease without esophagitis: Secondary | ICD-10-CM | POA: Insufficient documentation

## 2021-05-09 DIAGNOSIS — H1045 Other chronic allergic conjunctivitis: Secondary | ICD-10-CM | POA: Insufficient documentation

## 2021-05-09 DIAGNOSIS — J3081 Allergic rhinitis due to animal (cat) (dog) hair and dander: Secondary | ICD-10-CM | POA: Insufficient documentation

## 2021-05-09 DIAGNOSIS — J4520 Mild intermittent asthma, uncomplicated: Secondary | ICD-10-CM | POA: Insufficient documentation

## 2021-05-09 NOTE — Progress Notes (Signed)
°  Jordan Benton is a 61 y.o.male who presents with painful varicose veins of the left leg  Past Medical History:  Diagnosis Date   Allergy    followed by Allergist    Past Surgical History:  Procedure Laterality Date   KNEE SURGERY Left 1998    Current Outpatient Medications  Medication Sig Dispense Refill   albuterol (VENTOLIN HFA) 108 (90 Base) MCG/ACT inhaler SMARTSIG:1-2 Puff(s) Via Inhaler Every 4-6 Hours PRN     EPINEPHrine 0.3 mg/0.3 mL IJ SOAJ injection See admin instructions.     montelukast (SINGULAIR) 10 MG tablet Take 10 mg by mouth daily.     tamsulosin (FLOMAX) 0.4 MG CAPS capsule Take 0.4 mg by mouth daily.     No current facility-administered medications for this visit.    No Known Allergies  Indication: Patient presents with symptomatic varicose veins of the left lower extremity.  Procedure: Foam sclerotherapy was performed on the left lower extremity. Using ultrasound guidance, 5 mL of foam Sotradecol was used to inject the varicosities of the left lower extremity. Compression wraps were placed. The patient tolerated the procedure well.

## 2021-05-16 ENCOUNTER — Encounter (INDEPENDENT_AMBULATORY_CARE_PROVIDER_SITE_OTHER): Payer: Managed Care, Other (non HMO)

## 2021-06-13 ENCOUNTER — Encounter (INDEPENDENT_AMBULATORY_CARE_PROVIDER_SITE_OTHER): Payer: Self-pay | Admitting: Vascular Surgery

## 2021-06-13 ENCOUNTER — Encounter (INDEPENDENT_AMBULATORY_CARE_PROVIDER_SITE_OTHER): Payer: Managed Care, Other (non HMO) | Admitting: Vascular Surgery

## 2021-06-13 ENCOUNTER — Ambulatory Visit (INDEPENDENT_AMBULATORY_CARE_PROVIDER_SITE_OTHER): Payer: Managed Care, Other (non HMO) | Admitting: Vascular Surgery

## 2021-06-13 ENCOUNTER — Other Ambulatory Visit: Payer: Self-pay

## 2021-06-13 VITALS — BP 139/78 | HR 64 | Resp 16 | Wt 213.6 lb

## 2021-06-13 DIAGNOSIS — I83812 Varicose veins of left lower extremities with pain: Secondary | ICD-10-CM | POA: Diagnosis not present

## 2021-06-13 NOTE — Progress Notes (Signed)
°  Jordan Benton is a 61 y.o.male who presents with painful varicose veins of the left leg  Past Medical History:  Diagnosis Date   Allergy    followed by Allergist    Past Surgical History:  Procedure Laterality Date   KNEE SURGERY Left 1998    Current Outpatient Medications  Medication Sig Dispense Refill   albuterol (VENTOLIN HFA) 108 (90 Base) MCG/ACT inhaler SMARTSIG:1-2 Puff(s) Via Inhaler Every 4-6 Hours PRN     EPINEPHrine 0.3 mg/0.3 mL IJ SOAJ injection See admin instructions.     montelukast (SINGULAIR) 10 MG tablet Take 10 mg by mouth daily.     tamsulosin (FLOMAX) 0.4 MG CAPS capsule Take 0.4 mg by mouth daily.     No current facility-administered medications for this visit.    No Known Allergies  Indication: Patient presents with symptomatic varicose veins of the left lower extremity.  Procedure: Foam sclerotherapy was performed on the left lower extremity. Using ultrasound guidance, 5 mL of foam Sotradecol was used to inject the varicosities of the left lower extremity. Compression wraps were placed. The patient tolerated the procedure well.  

## 2021-06-20 ENCOUNTER — Telehealth (INDEPENDENT_AMBULATORY_CARE_PROVIDER_SITE_OTHER): Payer: Self-pay | Admitting: Vascular Surgery

## 2021-06-20 NOTE — Telephone Encounter (Signed)
LVM for pt TCB and schedule 3rd sclero for March (4 weeks after his 07/11/21 appt).  ? ?foam sclero Auth # [id number] good 04-25-21 until 10/22/21 ?

## 2021-07-11 ENCOUNTER — Other Ambulatory Visit: Payer: Self-pay

## 2021-07-11 ENCOUNTER — Encounter (INDEPENDENT_AMBULATORY_CARE_PROVIDER_SITE_OTHER): Payer: Self-pay | Admitting: Vascular Surgery

## 2021-07-11 ENCOUNTER — Ambulatory Visit (INDEPENDENT_AMBULATORY_CARE_PROVIDER_SITE_OTHER): Payer: Managed Care, Other (non HMO) | Admitting: Vascular Surgery

## 2021-07-11 VITALS — BP 144/89 | HR 63 | Resp 17 | Ht 71.0 in | Wt 215.6 lb

## 2021-07-11 DIAGNOSIS — I83812 Varicose veins of left lower extremities with pain: Secondary | ICD-10-CM

## 2021-07-11 NOTE — Progress Notes (Signed)
?  Jordan Benton is a 61 y.o.male who presents with painful varicose veins of the left leg ? ?Past Medical History:  ?Diagnosis Date  ? Allergy   ? followed by Allergist  ? ? ?Past Surgical History:  ?Procedure Laterality Date  ? KNEE SURGERY Left 1998  ? ? ?Current Outpatient Medications  ?Medication Sig Dispense Refill  ? albuterol (VENTOLIN HFA) 108 (90 Base) MCG/ACT inhaler SMARTSIG:1-2 Puff(s) Via Inhaler Every 4-6 Hours PRN    ? EPINEPHrine 0.3 mg/0.3 mL IJ SOAJ injection See admin instructions.    ? montelukast (SINGULAIR) 10 MG tablet Take 10 mg by mouth daily.    ? tamsulosin (FLOMAX) 0.4 MG CAPS capsule Take 0.4 mg by mouth daily.    ? ?No current facility-administered medications for this visit.  ? ? ?No Known Allergies ? ?Indication: Patient presents with symptomatic varicose veins of the left lower extremity. ? ?Procedure: Foam sclerotherapy was performed on the left lower extremity. Using ultrasound guidance, 5 mL of foam Sotradecol was used to inject the varicosities of the left lower extremity.  ?Compression wraps were placed. The patient tolerated the procedure well.  ? ?Patient still has significant residual varicosities and needs more foam sclerotherapy on the left leg. ?

## 2021-08-17 ENCOUNTER — Telehealth (INDEPENDENT_AMBULATORY_CARE_PROVIDER_SITE_OTHER): Payer: Self-pay | Admitting: Vascular Surgery

## 2021-08-17 NOTE — Telephone Encounter (Signed)
Spoke with pt and scheduled FOAM sclero. The authorization is for 3 total units. Pt is having 1st injection 6.13.23. If a wait list appt comes open, please watch the valid dates for May. The beginning of the auth does not start until May 16. He is wanting to try and come for the 1st visit in May due to deductible.  ? ?Left leg FOAM sclero. see jd. Josem Kaufmann [id number]. Exp: 5.16.23 - 11.12.23 ?

## 2021-09-26 ENCOUNTER — Ambulatory Visit (INDEPENDENT_AMBULATORY_CARE_PROVIDER_SITE_OTHER): Payer: Managed Care, Other (non HMO) | Admitting: Vascular Surgery

## 2021-09-26 VITALS — BP 137/82 | HR 70 | Resp 17 | Ht 71.0 in | Wt 219.0 lb

## 2021-09-26 DIAGNOSIS — I83812 Varicose veins of left lower extremities with pain: Secondary | ICD-10-CM

## 2021-09-26 NOTE — Progress Notes (Signed)
  Jordan Benton is a 61 y.o.male who presents with painful varicose veins of the left leg  Past Medical History:  Diagnosis Date   Allergy    followed by Allergist    Past Surgical History:  Procedure Laterality Date   KNEE SURGERY Left 1998    Current Outpatient Medications  Medication Sig Dispense Refill   albuterol (VENTOLIN HFA) 108 (90 Base) MCG/ACT inhaler SMARTSIG:1-2 Puff(s) Via Inhaler Every 4-6 Hours PRN     EPINEPHrine 0.3 mg/0.3 mL IJ SOAJ injection See admin instructions.     montelukast (SINGULAIR) 10 MG tablet Take 10 mg by mouth daily.     tamsulosin (FLOMAX) 0.4 MG CAPS capsule Take 0.4 mg by mouth daily.     No current facility-administered medications for this visit.    No Known Allergies  Indication: Patient presents with symptomatic varicose veins of the left lower extremity.  Procedure: Foam sclerotherapy was performed on the left lower extremity. Using ultrasound guidance, 5 mL of foam Sotradecol was used to inject the varicosities of the left lower extremity. Compression wraps were placed. The patient tolerated the procedure well.

## 2021-09-27 ENCOUNTER — Other Ambulatory Visit: Payer: Self-pay | Admitting: Family Medicine

## 2021-09-27 DIAGNOSIS — N401 Enlarged prostate with lower urinary tract symptoms: Secondary | ICD-10-CM

## 2021-09-27 DIAGNOSIS — Z0000 Encounter for general adult medical examination without abnormal findings: Secondary | ICD-10-CM

## 2021-10-06 ENCOUNTER — Ambulatory Visit
Admission: RE | Admit: 2021-10-06 | Discharge: 2021-10-06 | Disposition: A | Discharge status: Home / Self Care | Payer: Self-pay | Source: Ambulatory Visit | Attending: Family Medicine | Admitting: Family Medicine

## 2021-10-06 DIAGNOSIS — N401 Enlarged prostate with lower urinary tract symptoms: Secondary | ICD-10-CM

## 2021-10-06 DIAGNOSIS — Z0000 Encounter for general adult medical examination without abnormal findings: Secondary | ICD-10-CM

## 2022-01-09 ENCOUNTER — Ambulatory Visit (INDEPENDENT_AMBULATORY_CARE_PROVIDER_SITE_OTHER): Payer: Managed Care, Other (non HMO) | Admitting: Vascular Surgery

## 2022-01-09 ENCOUNTER — Encounter (INDEPENDENT_AMBULATORY_CARE_PROVIDER_SITE_OTHER): Payer: Self-pay | Admitting: Vascular Surgery

## 2022-01-09 VITALS — BP 150/84 | HR 56 | Resp 17 | Ht 70.0 in | Wt 213.0 lb

## 2022-01-09 DIAGNOSIS — I83812 Varicose veins of left lower extremities with pain: Secondary | ICD-10-CM | POA: Diagnosis not present

## 2022-01-09 NOTE — Progress Notes (Signed)
  Jordan Benton is a 61 y.o.male who presents with painful varicose veins of the left leg  Past Medical History:  Diagnosis Date   Allergy    followed by Allergist    Past Surgical History:  Procedure Laterality Date   KNEE SURGERY Left 1998    Current Outpatient Medications  Medication Sig Dispense Refill   albuterol (VENTOLIN HFA) 108 (90 Base) MCG/ACT inhaler SMARTSIG:1-2 Puff(s) Via Inhaler Every 4-6 Hours PRN     EPINEPHrine 0.3 mg/0.3 mL IJ SOAJ injection See admin instructions.     montelukast (SINGULAIR) 10 MG tablet Take 10 mg by mouth daily.     lovastatin (MEVACOR) 20 MG tablet Take 20 mg by mouth at bedtime.     tamsulosin (FLOMAX) 0.4 MG CAPS capsule Take 0.4 mg by mouth daily. (Patient not taking: Reported on 01/09/2022)     No current facility-administered medications for this visit.    No Known Allergies  Indication: Patient presents with symptomatic varicose veins of the left lower extremity.  Procedure: Foam sclerotherapy was performed on the left lower extremity. Using ultrasound guidance, 5 mL of foam Sotradecol was used to inject the varicosities of the left lower extremity. Compression wraps were placed. The patient tolerated the procedure well.

## 2022-02-12 ENCOUNTER — Encounter (INDEPENDENT_AMBULATORY_CARE_PROVIDER_SITE_OTHER): Payer: Self-pay

## 2022-06-29 ENCOUNTER — Ambulatory Visit: Payer: Managed Care, Other (non HMO)

## 2022-06-29 DIAGNOSIS — K640 First degree hemorrhoids: Secondary | ICD-10-CM | POA: Diagnosis not present

## 2022-06-29 DIAGNOSIS — Z1211 Encounter for screening for malignant neoplasm of colon: Secondary | ICD-10-CM | POA: Diagnosis present

## 2022-06-29 DIAGNOSIS — Z83719 Family history of colon polyps, unspecified: Secondary | ICD-10-CM

## 2023-03-06 ENCOUNTER — Telehealth (HOSPITAL_COMMUNITY): Payer: Self-pay | Admitting: *Deleted

## 2023-03-06 ENCOUNTER — Other Ambulatory Visit: Payer: Self-pay | Admitting: Cardiology

## 2023-03-06 DIAGNOSIS — I2510 Atherosclerotic heart disease of native coronary artery without angina pectoris: Secondary | ICD-10-CM

## 2023-03-06 DIAGNOSIS — R0609 Other forms of dyspnea: Secondary | ICD-10-CM

## 2023-03-06 MED ORDER — METOPROLOL TARTRATE 50 MG PO TABS: ORAL_TABLET | ORAL | 0 refills | Status: AC

## 2023-03-06 NOTE — Telephone Encounter (Signed)
Reaching out to patient to offer assistance regarding upcoming cardiac imaging study; pt verbalizes understanding of appt date/time, parking situation and where to check in, pre-test NPO status and medications ordered, and verified current allergies; name and call back number provided for further questions should they arise Hayley Sharpe RN Navigator Cardiac Imaging [facility] [phone] office [phone] cell  

## 2023-03-07 ENCOUNTER — Ambulatory Visit
Admission: RE | Admit: 2023-03-07 | Discharge: 2023-03-07 | Disposition: A | Discharge status: Home / Self Care | Payer: Managed Care, Other (non HMO) | Source: Ambulatory Visit | Attending: Cardiology | Admitting: Cardiology

## 2023-03-07 DIAGNOSIS — R0609 Other forms of dyspnea: Secondary | ICD-10-CM | POA: Insufficient documentation

## 2023-03-07 DIAGNOSIS — I2584 Coronary atherosclerosis due to calcified coronary lesion: Secondary | ICD-10-CM | POA: Insufficient documentation

## 2023-03-07 DIAGNOSIS — I2510 Atherosclerotic heart disease of native coronary artery without angina pectoris: Secondary | ICD-10-CM | POA: Diagnosis present

## 2023-03-07 MED ORDER — SODIUM CHLORIDE 0.9 % IV BOLUS
125.0000 mL | Freq: Once | INTRAVENOUS | Status: AC
  Administered 2023-03-07: 125 mL via INTRAVENOUS

## 2023-03-07 MED ORDER — IOHEXOL 350 MG/ML SOLN
100.0000 mL | Freq: Once | INTRAVENOUS | Status: AC | PRN
  Administered 2023-03-07: 100 mL via INTRAVENOUS

## 2023-03-07 MED ORDER — NITROGLYCERIN 0.4 MG SL SUBL
0.8000 mg | SUBLINGUAL_TABLET | Freq: Once | SUBLINGUAL | Status: AC
Start: 2023-03-07 — End: 2023-03-07
  Administered 2023-03-07: 0.8 mg via SUBLINGUAL

## 2023-03-07 NOTE — Progress Notes (Signed)
Patient tolerated procedure well. Ambulate w/o difficulty. Denies light headedness or being dizzy. Sitting up drinking water provided. Encouraged to drink extra water today and reasoning explained. Verbalized understanding. All questions answered. ABC intact. No further needs. Discharge from procedure area w/o issues.

## 2023-09-16 ENCOUNTER — Telehealth (INDEPENDENT_AMBULATORY_CARE_PROVIDER_SITE_OTHER): Payer: Self-pay

## 2023-09-16 NOTE — Telephone Encounter (Signed)
 Patient called stating that he had foam sclero done 1.5 years ago on his left leg (01/10/24). He now has a mild throbbing pain in a spot that has popped up on his right leg and would like to get checked before it gets too bad. He stated he would like to have foam sclero done before it gets too out of hand.   Please advise

## 2023-09-16 NOTE — Telephone Encounter (Signed)
 He needs to come in for reflux studies and can see Dew or myself for evaluation

## 2023-10-25 ENCOUNTER — Other Ambulatory Visit (INDEPENDENT_AMBULATORY_CARE_PROVIDER_SITE_OTHER): Payer: Self-pay | Admitting: Nurse Practitioner

## 2023-10-25 DIAGNOSIS — I83811 Varicose veins of right lower extremities with pain: Secondary | ICD-10-CM

## 2023-11-01 ENCOUNTER — Ambulatory Visit (INDEPENDENT_AMBULATORY_CARE_PROVIDER_SITE_OTHER)

## 2023-11-01 ENCOUNTER — Encounter (INDEPENDENT_AMBULATORY_CARE_PROVIDER_SITE_OTHER): Payer: Self-pay | Admitting: Nurse Practitioner

## 2023-11-01 ENCOUNTER — Ambulatory Visit (INDEPENDENT_AMBULATORY_CARE_PROVIDER_SITE_OTHER): Admitting: Nurse Practitioner

## 2023-11-01 VITALS — BP 144/89 | HR 82 | Resp 18 | Ht 71.0 in | Wt 220.4 lb

## 2023-11-01 DIAGNOSIS — I83812 Varicose veins of left lower extremities with pain: Secondary | ICD-10-CM | POA: Diagnosis not present

## 2023-11-01 DIAGNOSIS — I83811 Varicose veins of right lower extremities with pain: Secondary | ICD-10-CM | POA: Diagnosis not present

## 2023-11-04 NOTE — Progress Notes (Signed)
 Subjective:    Patient ID: Jordan Benton, male    DOB: [birth year], 63 y.o.   MRN: [id number] Chief Complaint  Patient presents with   Follow-up    Last seen 01/09/22, reflux, mild throbbing pain, spot popped up on his Right leg    Samnang Shugars is a 63 year old male that presents today for evaluation of painful varicosities of the left lower extremity.  The patient has previously had treatment of his bilateral varicose veins.  He notes that the left little stripped.  This was done over 15 years ago.  He also notes that he has had foam sclerotherapy done to them as well.  However despite continued conservative therapy including use of compression socks, elevation and activity the patient continues to have numerous large varicosities that have become painful and uncomfortable.  The patient notes that on days where he stands for longer periods the pain makes it difficult for doing normal activities of daily living.  There are no open wounds or ulcerations.  Today noninvasive studies show no evidence of DVT or superficial thrombophlebitis.  The right lower extremity has evidence of deep venous insufficiency.  There is no evidence of superficial venous reflux but there is a large notable varicosity in the area that is painful for the patient.    Review of Systems  Cardiovascular:  Positive for leg swelling.  All other systems reviewed and are negative.      Objective:   Physical Exam Vitals reviewed.  HENT:     Head: Normocephalic.  Cardiovascular:     Rate and Rhythm: Normal rate.     Pulses: Normal pulses.  Pulmonary:     Effort: Pulmonary effort is normal.  Musculoskeletal:        General: Tenderness present.  Skin:    General: Skin is warm and dry.     Comments: Large palpable varicosities in the left thigh  Neurological:     Mental Status: He is alert and oriented to person, place, and time.  Psychiatric:        Mood and Affect: Mood normal.        Behavior: Behavior  normal.        Thought Content: Thought content normal.        Judgment: Judgment normal.     BP (!) 144/89 (BP Location: Left Arm, Patient Position: Sitting, Cuff Size: Normal)   Pulse 82   Resp 18   Ht 5' 11 (1.803 m)   Wt 220 lb 6.4 oz (100 kg)   BMI 30.74 kg/m   Past Medical History:  Diagnosis Date   Allergy    followed by Allergist    Social History   Socioeconomic History   Marital status: Married    Spouse name: Not on file   Number of children: Not on file   Years of education: Not on file   Highest education level: Not on file  Occupational History   Not on file  Tobacco Use   Smoking status: Never   Smokeless tobacco: Never  Substance and Sexual Activity   Alcohol use: Yes    Comment: occasional 2-3 glasses of wine/week   Drug use: No   Sexual activity: Yes  Other Topics Concern   Not on file  Social History Narrative   Not on file   Social Drivers of Health   Financial Resource Strain: Low Risk  (02/19/2023)   Received from [facility]   Overall Financial Resource Strain (CARDIA)  Difficulty of Paying Living Expenses: Not hard at all  Food Insecurity: No Food Insecurity (02/19/2023)   Received from [facility]   Hunger Vital Sign    Within the past 12 months, you worried that your food would run out before you got the money to buy more.: Never true    Within the past 12 months, the food you bought just didn't last and you didn't have money to get more.: Never true  Transportation Needs: No Transportation Needs (02/19/2023)   Received from [facility] - Transportation    In the past 12 months, has lack of transportation kept you from medical appointments or from getting medications?: No    Lack of Transportation (Non-Medical): No  Physical Activity: Not on file  Stress: Not on file  Social Connections: Not on file  Intimate Partner Violence: Not on file    Past Surgical History:   Procedure Laterality Date   KNEE SURGERY Left 1998    Family History  Problem Relation Age of Onset   COPD Mother    Diabetes Mother     No Known Allergies     Latest Ref Rng & Units 05/08/2016    3:03 PM  CBC  WBC 3.8 - 10.8 K/uL 7.5   Hemoglobin 13.2 - 17.1 g/dL 85.8   Hematocrit 61.4 - 50.0 % 42.0   Platelets 140 - 400 K/uL 240       CMP     Component Value Date/Time   NA 141 05/08/2016 1503   K 4.3 05/08/2016 1503   CL 103 05/08/2016 1503   CO2 27 05/08/2016 1503   GLUCOSE 83 05/08/2016 1503   BUN 19 05/08/2016 1503   CREATININE 1.00 05/08/2016 1503   CALCIUM 9.2 05/08/2016 1503   PROT 6.5 05/08/2016 1503   ALBUMIN 4.3 05/08/2016 1503   AST 18 05/08/2016 1503   ALT 20 05/08/2016 1503   ALKPHOS 66 05/08/2016 1503   BILITOT 0.6 05/08/2016 1503   GFRNONAA 84 05/08/2016 1503   GFRAA >89 05/08/2016 1503     No results found.     Assessment & Plan:   1. Varicose veins of left lower extremity with pain Recommend:  The patient has had successful ablation of the previously incompetent saphenous venous system but still has persistent symptoms of pain and swelling that are having a negative impact on daily life and daily activities.  Patient should undergo injection sclerotherapy to treat the residual varicosities.  The risks, benefits and alternative therapies were reviewed in detail with the patient.  All questions were answered.  The patient agrees to proceed with sclerotherapy at their convenience.  The patient will continue wearing the graduated compression stockings and using the over-the-counter pain medications to treat her symptoms.        Current Outpatient Medications on File Prior to Visit  Medication Sig Dispense Refill   albuterol (VENTOLIN HFA) 108 (90 Base) MCG/ACT inhaler SMARTSIG:1-2 Puff(s) Via Inhaler Every 4-6 Hours PRN     EPINEPHrine 0.3 mg/0.3 mL IJ SOAJ injection See admin instructions. (Patient taking differently: Inject 0.3 mg  into the muscle as needed for anaphylaxis (allergy shots).)     montelukast (SINGULAIR) 10 MG tablet Take 10 mg by mouth daily.     lovastatin (MEVACOR) 20 MG tablet Take 20 mg by mouth at bedtime.     metoprolol  tartrate (LOPRESSOR ) 50 MG tablet Take 50 mg (1 tablet) TWO hours prior to CT scan 1 tablet  0   tamsulosin (FLOMAX) 0.4 MG CAPS capsule Take 0.4 mg by mouth daily. (Patient not taking: Reported on 01/09/2022)     No current facility-administered medications on file prior to visit.    There are no Patient Instructions on file for this visit. No follow-ups on file.   Geoffery Aultman E Jolina Symonds, NP

## 2023-11-26 ENCOUNTER — Telehealth (INDEPENDENT_AMBULATORY_CARE_PROVIDER_SITE_OTHER): Payer: Self-pay | Admitting: Nurse Practitioner

## 2023-11-26 NOTE — Telephone Encounter (Signed)
 LVM for pt TCB and schedule SALINE sclero appt  left leg SALINE sclero - see fb. shara #[phone] exp: 8.11.25 - 2.8.26 - 3 units  X 3

## 2023-12-13 IMAGING — CT CT CARDIAC CORONARY ARTERY CALCIUM SCORE
2 of 3 series · 12 of 20 positions shown, 15 images · non-contrast
Comparison: None available

CLINICAL DATA: 61-year-old white male presents for evaluation of
coronary artery calcium.

EXAM:
CT CARDIAC CORONARY ARTERY CALCIUM SCORE
TECHNIQUE: Non-contrast imaging through the heart was performed using
prospective ECG gating. Image post processing was performed on an
independent workstation, allowing for quantitative analysis of the
heart and coronary arteries. Note that this exam targets the heart
and the chest was not imaged in its entirety.

[Series 2: cascseq 2.0 d10f 70% · axial · 0.33mm/px · z∈[-117,-33]mm · 3 of 86 slices shown]
[im 22/86  vessel]
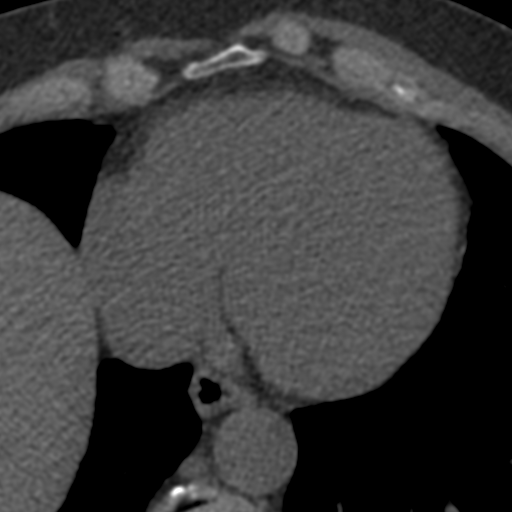
[im 43/86  vessel]
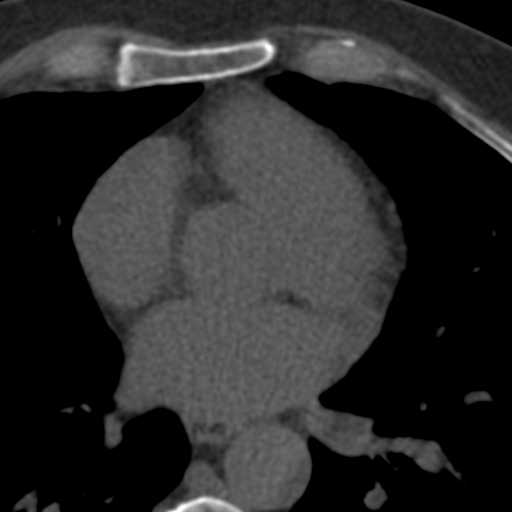
[im 64/86  vessel]
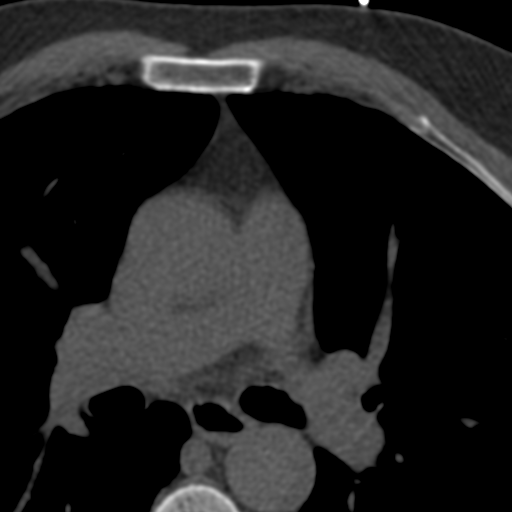

[Series 3: thin sfov · axial · 0.33mm/px · z∈[-143,-7]mm · 9 of 172 slices shown, 12 images]
[im 18/172  vessel]
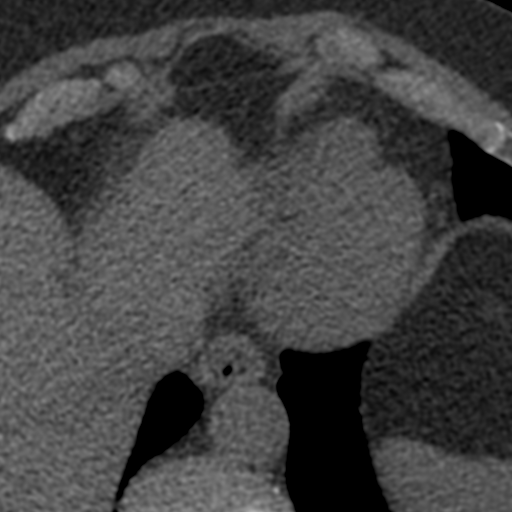
[im 18/172  lung]
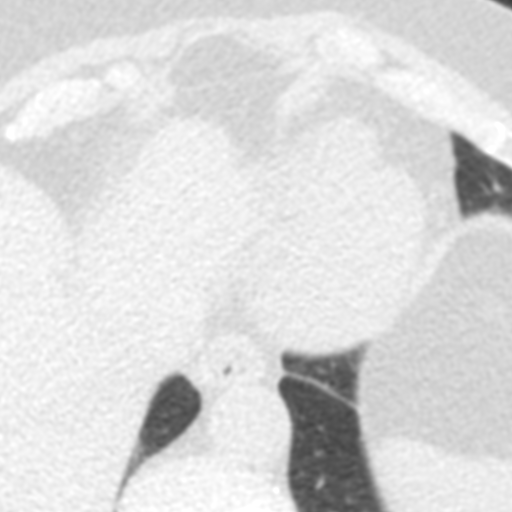
[im 35/172  vessel]
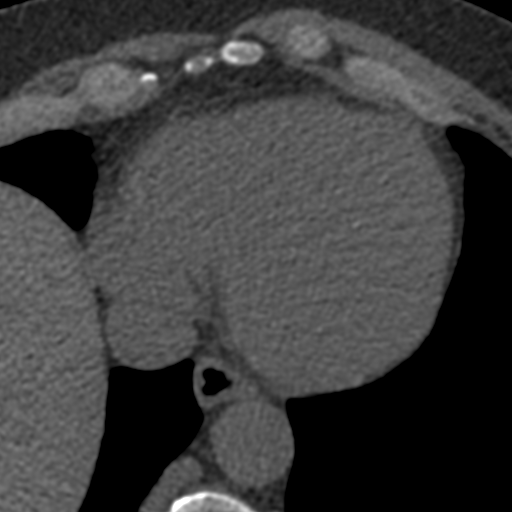
[im 52/172  vessel]
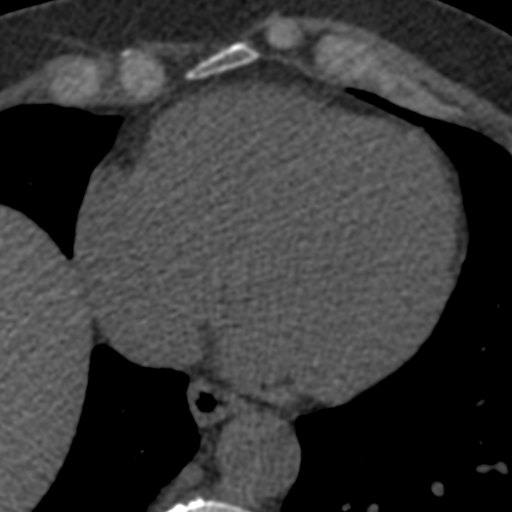
[im 69/172  vessel]
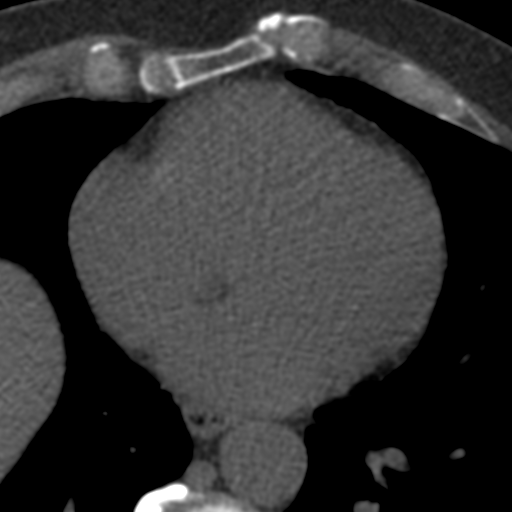
[im 86/172  vessel]
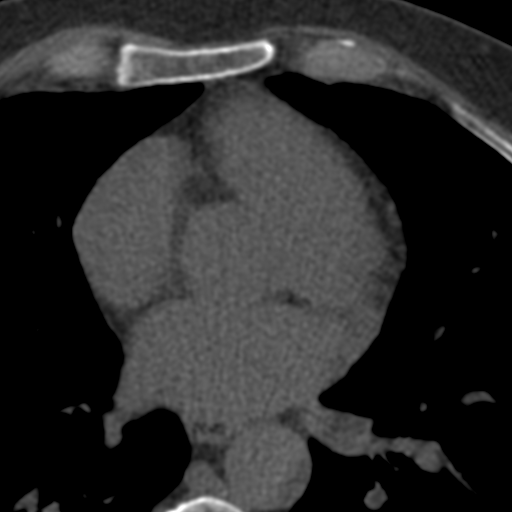
[im 86/172  lung]
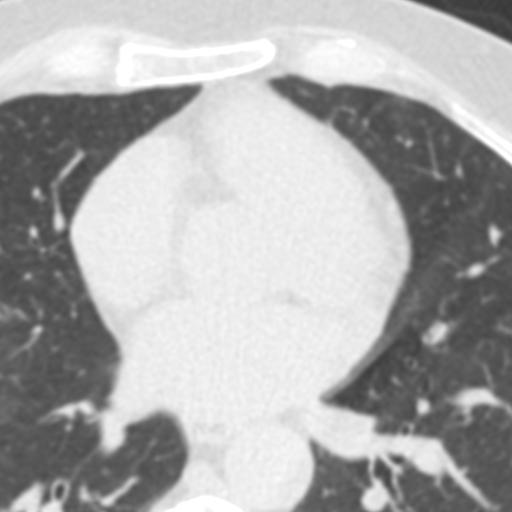
[im 103/172  vessel]
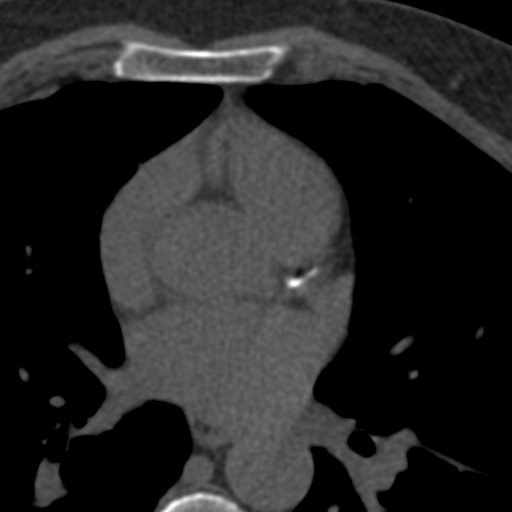
[im 120/172  vessel]
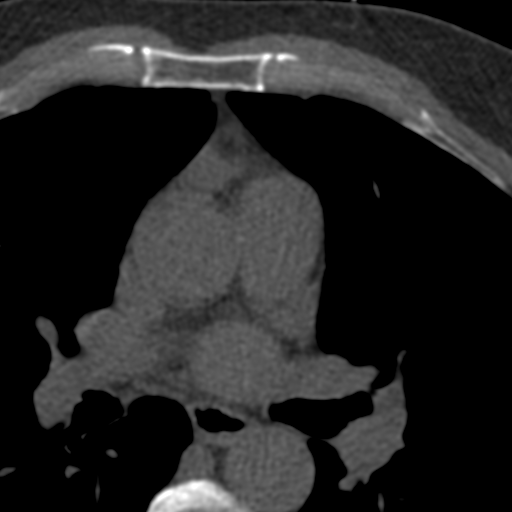
[im 137/172  vessel]
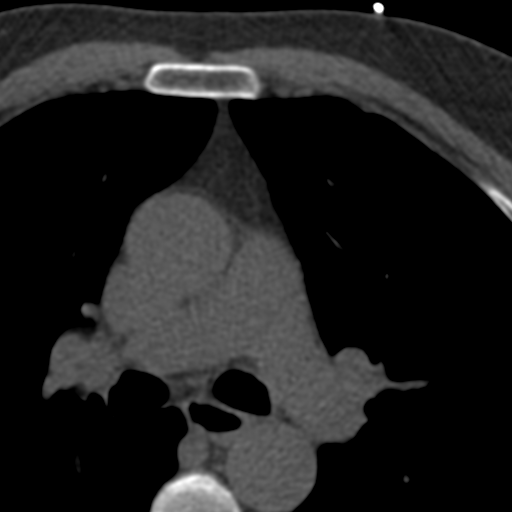
[im 154/172  vessel]
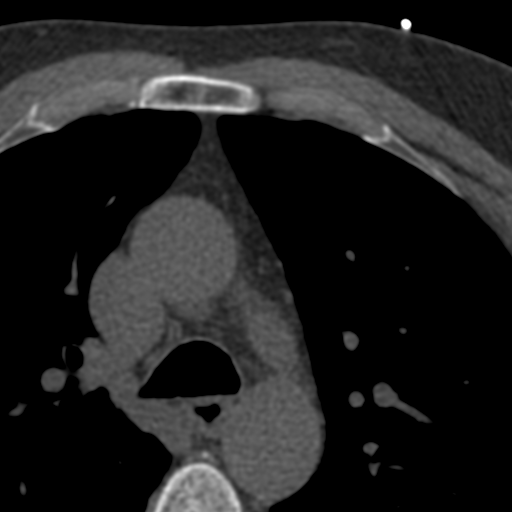
[im 154/172  lung]
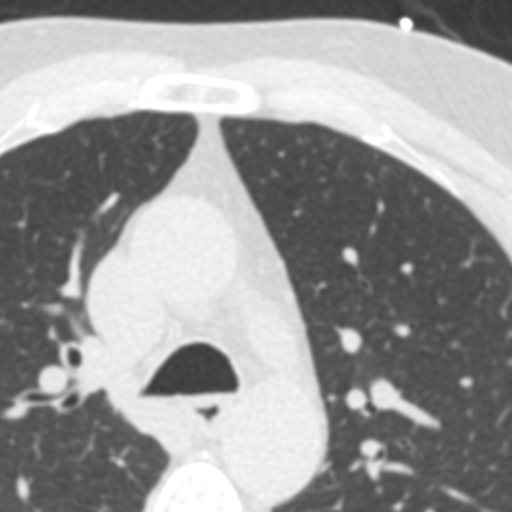

[12 of 20 positions shown; findings below may reference images not displayed]

FINDINGS: CORONARY CALCIUM SCORES:

Left Main: 0

LAD: 175

LCx: 0

RCA: 0

Total Agatston Score: 175

[HOSPITAL] percentile: 74

AORTA MEASUREMENTS:

Ascending Aorta: 32 mm

Descending Aorta: 27 mm

OTHER FINDINGS:

Cardiovascular: Aortic caliber is normal. Caliber of visualized
central pulmonary vasculature is unremarkable. Normal heart size
without pericardial effusion or nodularity. Limited assessment of
cardiovascular structures given lack of intravenous contrast.

Mediastinum/Nodes: No adenopathy or acute process in visualized
portions of the mediastinum. Mildly patulous esophagus.

Lungs/Pleura: Airways are patent. Lungs are clear to the extent
evaluated on this exam focused on the mid chest and cardiac
structures.

Upper Abdomen: Incidental imaging of upper abdominal contents is
unremarkable.

Musculoskeletal: No acute or destructive bone process.
IMPRESSION: Coronary artery calcium score of 175. This places the patient in the
74th percentile for age, gender and race/ethnicity.

## 2023-12-20 ENCOUNTER — Encounter (INDEPENDENT_AMBULATORY_CARE_PROVIDER_SITE_OTHER): Payer: Self-pay | Admitting: Nurse Practitioner

## 2023-12-20 ENCOUNTER — Ambulatory Visit (INDEPENDENT_AMBULATORY_CARE_PROVIDER_SITE_OTHER): Admitting: Nurse Practitioner

## 2023-12-20 VITALS — BP 130/90 | HR 80 | Ht 71.0 in | Wt 220.0 lb

## 2023-12-20 DIAGNOSIS — I83812 Varicose veins of left lower extremities with pain: Secondary | ICD-10-CM

## 2023-12-22 ENCOUNTER — Encounter (INDEPENDENT_AMBULATORY_CARE_PROVIDER_SITE_OTHER): Payer: Self-pay | Admitting: Nurse Practitioner

## 2023-12-22 NOTE — Progress Notes (Signed)
Varicose veins of left lower extremity with inflammation (454.1  I83.10) Current Plans   Indication: Patient presents with symptomatic varicose veins of the left lower extremity.   Procedure: Sclerotherapy using hypertonic saline mixed with 1% Lidocaine was performed on the left lower extremity. Compression wraps were placed. The patient tolerated the procedure well. 

## 2024-01-24 ENCOUNTER — Ambulatory Visit (INDEPENDENT_AMBULATORY_CARE_PROVIDER_SITE_OTHER): Admitting: Nurse Practitioner

## 2024-01-24 DIAGNOSIS — I83812 Varicose veins of left lower extremities with pain: Secondary | ICD-10-CM | POA: Diagnosis not present

## 2024-01-27 ENCOUNTER — Encounter (INDEPENDENT_AMBULATORY_CARE_PROVIDER_SITE_OTHER): Payer: Self-pay | Admitting: Nurse Practitioner

## 2024-01-27 NOTE — Progress Notes (Signed)
Varicose veins of left lower extremity with inflammation (454.1  I83.10) Current Plans   Indication: Patient presents with symptomatic varicose veins of the left lower extremity.   Procedure: Sclerotherapy using hypertonic saline mixed with 1% Lidocaine was performed on the left lower extremity. Compression wraps were placed. The patient tolerated the procedure well. 

## 2024-02-21 ENCOUNTER — Ambulatory Visit (INDEPENDENT_AMBULATORY_CARE_PROVIDER_SITE_OTHER): Admitting: Nurse Practitioner

## 2024-02-21 VITALS — BP 117/73 | HR 62 | Resp 18 | Ht 71.0 in | Wt 220.0 lb

## 2024-02-21 DIAGNOSIS — I83812 Varicose veins of left lower extremities with pain: Secondary | ICD-10-CM

## 2024-02-22 ENCOUNTER — Encounter (INDEPENDENT_AMBULATORY_CARE_PROVIDER_SITE_OTHER): Payer: Self-pay | Admitting: Nurse Practitioner

## 2024-02-22 NOTE — Progress Notes (Signed)
Varicose veins of left lower extremity with inflammation (454.1  I83.10) Current Plans   Indication: Patient presents with symptomatic varicose veins of the left lower extremity.   Procedure: Sclerotherapy using hypertonic saline mixed with 1% Lidocaine was performed on the left lower extremity. Compression wraps were placed. The patient tolerated the procedure well.
